# Patient Record
Sex: Male | Born: 1935 | Race: Black or African American | Hispanic: No | Marital: Single | Smoking: Current every day smoker
Health system: Southern US, Community
[De-identification: ages and names within clinical notes are randomized; demographics above are authoritative.]

## PROBLEM LIST (undated history)

## (undated) DIAGNOSIS — J449 Chronic obstructive pulmonary disease, unspecified: Secondary | ICD-10-CM

## (undated) DIAGNOSIS — N400 Enlarged prostate without lower urinary tract symptoms: Secondary | ICD-10-CM

## (undated) DIAGNOSIS — R51 Headache: Secondary | ICD-10-CM

## (undated) DIAGNOSIS — N471 Phimosis: Secondary | ICD-10-CM

## (undated) DIAGNOSIS — F32A Depression, unspecified: Secondary | ICD-10-CM

## (undated) DIAGNOSIS — Z993 Dependence on wheelchair: Secondary | ICD-10-CM

## (undated) DIAGNOSIS — K219 Gastro-esophageal reflux disease without esophagitis: Secondary | ICD-10-CM

## (undated) DIAGNOSIS — E785 Hyperlipidemia, unspecified: Secondary | ICD-10-CM

## (undated) DIAGNOSIS — R32 Unspecified urinary incontinence: Secondary | ICD-10-CM

## (undated) DIAGNOSIS — J45909 Unspecified asthma, uncomplicated: Secondary | ICD-10-CM

## (undated) DIAGNOSIS — N4889 Other specified disorders of penis: Secondary | ICD-10-CM

## (undated) DIAGNOSIS — F329 Major depressive disorder, single episode, unspecified: Secondary | ICD-10-CM

## (undated) DIAGNOSIS — M1990 Unspecified osteoarthritis, unspecified site: Secondary | ICD-10-CM

## (undated) DIAGNOSIS — D509 Iron deficiency anemia, unspecified: Secondary | ICD-10-CM

## (undated) DIAGNOSIS — M6281 Muscle weakness (generalized): Secondary | ICD-10-CM

## (undated) DIAGNOSIS — Z8659 Personal history of other mental and behavioral disorders: Secondary | ICD-10-CM

## (undated) HISTORY — PX: TOTAL HIP ARTHROPLASTY: SHX124

---

## 2001-08-22 ENCOUNTER — Inpatient Hospital Stay (HOSPITAL_COMMUNITY): Admission: EM | Admit: 2001-08-22 | Discharge: 2001-08-29 | Payer: Self-pay | Admitting: Psychiatry

## 2001-08-24 ENCOUNTER — Encounter: Payer: Self-pay | Admitting: Psychiatry

## 2001-11-15 ENCOUNTER — Inpatient Hospital Stay (HOSPITAL_COMMUNITY): Admission: RE | Admit: 2001-11-15 | Discharge: 2001-11-27 | Payer: Self-pay | Admitting: Orthopaedic Surgery

## 2001-11-16 ENCOUNTER — Encounter: Payer: Self-pay | Admitting: Orthopaedic Surgery

## 2001-11-16 ENCOUNTER — Encounter: Payer: Self-pay | Admitting: Cardiology

## 2001-11-20 ENCOUNTER — Encounter: Payer: Self-pay | Admitting: Orthopaedic Surgery

## 2002-04-30 ENCOUNTER — Inpatient Hospital Stay (HOSPITAL_COMMUNITY): Admission: EM | Admit: 2002-04-30 | Discharge: 2002-05-08 | Payer: Self-pay | Admitting: Psychiatry

## 2005-06-07 ENCOUNTER — Ambulatory Visit: Payer: Self-pay | Admitting: Gastroenterology

## 2005-06-10 ENCOUNTER — Ambulatory Visit: Payer: Self-pay | Admitting: Gastroenterology

## 2007-03-03 ENCOUNTER — Emergency Department (HOSPITAL_COMMUNITY): Admission: EM | Admit: 2007-03-03 | Discharge: 2007-03-03 | Payer: Self-pay | Admitting: Emergency Medicine

## 2010-02-11 ENCOUNTER — Encounter: Payer: Self-pay | Admitting: Gastroenterology

## 2010-02-25 ENCOUNTER — Ambulatory Visit: Payer: Self-pay | Admitting: Cardiology

## 2010-02-25 ENCOUNTER — Observation Stay (HOSPITAL_COMMUNITY)
Admission: EM | Admit: 2010-02-25 | Discharge: 2010-03-01 | Disposition: A | Discharge status: Other Acute Inpatient Hospital | Payer: Self-pay | Source: Home / Self Care | Admitting: Emergency Medicine

## 2010-02-27 ENCOUNTER — Encounter (INDEPENDENT_AMBULATORY_CARE_PROVIDER_SITE_OTHER): Payer: Self-pay | Admitting: Internal Medicine

## 2010-03-19 ENCOUNTER — Encounter (INDEPENDENT_AMBULATORY_CARE_PROVIDER_SITE_OTHER): Payer: Self-pay | Admitting: *Deleted

## 2010-03-23 ENCOUNTER — Ambulatory Visit: Payer: Self-pay | Admitting: Gastroenterology

## 2010-04-05 ENCOUNTER — Ambulatory Visit: Payer: Self-pay | Admitting: Gastroenterology

## 2010-07-08 NOTE — Miscellaneous (Signed)
Summary: [facility] Previsit/prep  Clinical Lists Changes  Medications: Added new medication of MOVIPREP 100 GM  SOLR (PEG-KCL-NACL-NASULF-NA ASC-C) As per prep instructions. - Signed Rx of MOVIPREP 100 GM  SOLR (PEG-KCL-NACL-NASULF-NA ASC-C) As per prep instructions.;  #1 x 0;  Signed;  Entered by: Wyona Almas RN;  Authorized by: Mardella Layman MD [facility];  Method used: Print then Give to Patient Allergies: Added new allergy or adverse reaction of PENICILLIN Observations: Added new observation of NKA: F (03/23/2010 10:35)    Prescriptions: MOVIPREP 100 GM  SOLR (PEG-KCL-NACL-NASULF-NA ASC-C) As per prep instructions.  #1 x 0   Entered by:   Wyona Almas RN   Authorized by:   Mardella Layman MD [facility]   Signed by:   Wyona Almas RN on 03/23/2010   Method used:   Print then Give to Patient   RxID:   [id number]

## 2010-07-08 NOTE — Letter (Signed)
Summary: Pre Visit Letter Revised  [facility]  [address] [place], [place] [address]   Phone: [phone]  Fax: [phone]        02/11/2010 MRN: [id number] Chase Benjamin [address] [place], [place]  [address]             Procedure Date:  Oct 31 at 10am   Welcome to the Gastroenterology Division at [facility].    You are scheduled to see a nurse for your pre-procedure visit on Mar 23, 2010 at 11am on the 3rd floor at [facility], [address]  We ask that you try to arrive at our office 15 minutes prior to your appointment time to allow for check-in.  Please take a minute to review the attached form.  If you answer "Yes" to one or more of the questions on the first page, we ask that you call the person listed at your earliest opportunity.  If you answer "No" to all of the questions, please complete the rest of the form and bring it to your appointment.    Your nurse visit will consist of discussing your medical and surgical history, your immediate family medical history, and your medications.   If you are unable to list all of your medications on the form, please bring the medication bottles to your appointment and we will list them.  We will need to be aware of both prescribed and over the counter drugs.  We will need to know exact dosage information as well.    Please be prepared to read and sign documents such as consent forms, a financial agreement, and acknowledgement forms.  If necessary, and with your consent, a friend or relative is welcome to sit-in on the nurse visit with you.  Please bring your insurance card so that we may make a copy of it.  If your insurance requires a referral to see a specialist, please bring your referral form from your primary care physician.  No co-pay is required for this nurse visit.     If you cannot keep your appointment, please call [phone] to cancel or reschedule prior to your appointment date.  This  allows [place] the opportunity to schedule an appointment for another patient in need of care.    Thank you for choosing [facility] for your medical needs.  We appreciate the opportunity to care for you.  Please visit [place] at our website  to learn more about our practice.  Sincerely, The Gastroenterology Division

## 2010-07-08 NOTE — Procedures (Signed)
Summary: Colonoscopy  Patient: Chase Benjamin Note: All result statuses are Final unless otherwise noted.  Tests: (1) Colonoscopy (COL)   COL Colonoscopy           DONE     [facility]     [address]     [place], [place]  [address]           COLONOSCOPY PROCEDURE REPORT           PATIENT:  Chase Benjamin, Chase Benjamin  MR#:  [id number]     BIRTHDATE:  01-29-1932, 77 yrs. old  GENDER:  male     ENDOSCOPIST:  Vania Rea. Jarold Motto, MD, [facility]     REF. BY:  Providence Crosby, M.D.     PROCEDURE DATE:  04/05/2010     PROCEDURE:  Average-risk screening colonoscopy     G0121     ASA CLASS:  Class II     INDICATIONS:  Routine Risk Screening     MEDICATIONS:   Fentanyl 37.5 mcg IV, Versed 5 mg IV           DESCRIPTION OF PROCEDURE:   After the risks benefits and     alternatives of the procedure were thoroughly explained, informed     consent was obtained.  Digital rectal exam was performed and     revealed no abnormalities.   The LB CF-[id number] endoscope     was introduced through the anus and advanced to the cecum, which     was identified by both the appendix and ileocecal valve, without     limitations.  The quality of the prep was excellent, using     MoviPrep.  The instrument was then slowly withdrawn as the colon     was fully examined.     <<PROCEDUREIMAGES>>           FINDINGS:  Scattered diverticula were found in the sigmoid to     descending colon segments.  No polyps or cancers were seen.  This     was otherwise a normal examination of the colon.   Retroflexed     views in the rectum revealed Unable to retroflex.    The scope was     then withdrawn from the patient and the procedure completed.           COMPLICATIONS:  None     ENDOSCOPIC IMPRESSION:     1) Diverticula, scattered in the sigmoid to descending colon     segments     2) No polyps or cancers     3) Otherwise normal examination     RECOMMENDATIONS:     PRN F/U ONLY     REPEAT EXAM:  No        ______________________________     Vania Rea. Jarold Motto, MD, [facility]           CC:           n.     eSIGNED:   Vania Rea. Patterson at 04/05/2010 11:38 AM           Victoriano Lain, [id number]  Note: An exclamation mark (!) indicates a result that was not dispersed into the flowsheet. Document Creation Date: 04/05/2010 11:39 AM _______________________________________________________________________  (1) Order result status: Final Collection or observation date-time: 04/05/2010 11:32 Requested date-time:  Receipt date-time:  Reported date-time:  Referring Physician:   Ordering Physician: Sheryn Bison [phone]) Specimen Source:  Source: Launa Grill Order Number: [id number] Lab site:

## 2010-07-08 NOTE — Letter (Signed)
Summary: [facility] Instructions  [facility]  [address] [place], [place] [address]   Phone: [phone]  Fax: [phone]       Chase Benjamin    [birth year]    MRN: [id number]        Procedure Day [name]:  [name]  04/05/10     Arrival Time:  9:00AM     Procedure Time:  10:00AM     Location of Procedure:                    [name] _  [facility] (4th Floor)                       PREPARATION FOR COLONOSCOPY WITH MOVIPREP   Starting 5 days prior to your procedure 03/31/10 do not eat nuts, seeds, popcorn, corn, beans, peas,  salads, or any raw vegetables.  Do not take any fiber supplements (e.g. Metamucil, Citrucel, and Benefiber).  THE DAY BEFORE YOUR PROCEDURE         DATE:  04/04/10   DAY:  SUNDAY  1.  Drink clear liquids the entire day-NO SOLID FOOD  2.  Do not drink anything colored red or purple.  Avoid juices with pulp.  No orange juice.  3.  Drink at least 64 oz. (8 glasses) of fluid/clear liquids during the day to prevent dehydration and help the prep work efficiently.  CLEAR LIQUIDS INCLUDE: Water Jello Ice Popsicles Tea (sugar ok, no milk/cream) Powdered fruit flavored drinks Coffee (sugar ok, no milk/cream) Gatorade Juice: apple, white grape, white cranberry  Lemonade Clear bullion, consomm, broth Carbonated beverages (any kind) Strained chicken noodle soup Hard Candy                             4.  In the morning, mix first dose of MoviPrep solution:    Empty 1 Pouch A and 1 Pouch B into the disposable container    Add lukewarm drinking water to the top line of the container. Mix to dissolve    Refrigerate (mixed solution should be used within 24 hrs)  5.  Begin drinking the prep at 5:00 p.m. The MoviPrep container is divided by 4 marks.   Every 15 minutes drink the solution down to the next mark (approximately 8 oz) until the full liter is complete.   6.  Follow completed prep with 16 oz of clear liquid of your choice (Nothing  red or purple).  Continue to drink clear liquids until bedtime.  7.  Before going to bed, mix second dose of MoviPrep solution:    Empty 1 Pouch A and 1 Pouch B into the disposable container    Add lukewarm drinking water to the top line of the container. Mix to dissolve    Refrigerate  THE DAY OF YOUR PROCEDURE      DATE: 04/05/10   DAY:  MONDAY  Beginning at 5:00AM (5 hours before procedure):         1. Every 15 minutes, drink the solution down to the next mark (approx 8 oz) until the full liter is complete.  2. Follow completed prep with 16 oz. of clear liquid of your choice.    3. You may drink clear liquids until 8:00AM (2 HOURS BEFORE PROCEDURE).   MEDICATION INSTRUCTIONS  Unless otherwise instructed, you should take regular prescription medications with a small sip of water   as early  as possible the morning of your procedure.  Best to take at 8:00AM after prep has eased off.       OTHER INSTRUCTIONS  You will need a responsible adult at least 75 years of age to accompany you and drive you home.   This person must remain in the waiting room during your procedure.  Wear loose fitting clothing that is easily removed.  Leave jewelry and other valuables at home.  However, you may wish to bring a book to read or  an iPod/MP3 player to listen to music as you wait for your procedure to start.  Remove all body piercing jewelry and leave at home.  Total time from sign-in until discharge is approximately 2-3 hours.  You should go home directly after your procedure and rest.  You can resume normal activities the  day after your procedure.  The day of your procedure you should not:   Drive   Make legal decisions   Operate machinery   Drink alcohol   Return to work  You will receive specific instructions about eating, activities and medications before you leave.    The above instructions have been reviewed and explained to me by  .sign    I fully understand  and can verbalize these instructions _____________________________ Date _________

## 2010-08-19 LAB — BASIC METABOLIC PANEL
BUN: 13 mg/dL (ref 6–23)
BUN: 16 mg/dL (ref 6–23)
CO2: 21 mEq/L (ref 19–32)
CO2: 22 mEq/L (ref 19–32)
CO2: 22 mEq/L (ref 19–32)
CO2: 23 mEq/L (ref 19–32)
Calcium: 8.4 mg/dL (ref 8.4–10.5)
Chloride: 110 mEq/L (ref 96–112)
Chloride: 110 mEq/L (ref 96–112)
Chloride: 111 mEq/L (ref 96–112)
Creatinine, Ser: 1.29 mg/dL (ref 0.4–1.5)
Creatinine, Ser: 1.47 mg/dL (ref 0.4–1.5)
GFR calc Af Amer: >60 mL/min (ref >60)
GFR calc Af Amer: >60 mL/min (ref >60)
GFR calc non Af Amer: 47 mL/min — ABNORMAL LOW (ref >60)
GFR calc non Af Amer: 58 mL/min — ABNORMAL LOW (ref >60)
Glucose, Bld: 102 mg/dL — ABNORMAL HIGH (ref 70–99)
Glucose, Bld: 104 mg/dL — ABNORMAL HIGH (ref 70–99)
Glucose, Bld: 111 mg/dL — ABNORMAL HIGH (ref 70–99)
Potassium: 3.5 mEq/L (ref 3.5–5.1)
Potassium: 3.5 mEq/L (ref 3.5–5.1)
Potassium: 3.6 mEq/L (ref 3.5–5.1)
Sodium: 140 mEq/L (ref 135–145)
Sodium: 140 mEq/L (ref 135–145)

## 2010-08-19 LAB — POCT CARDIAC MARKERS
CKMB, poc: <1 ng/mL — ABNORMAL LOW (ref 1.0–8.0)
CKMB, poc: <1 ng/mL — ABNORMAL LOW (ref 1.0–8.0)
Myoglobin, poc: 48 ng/mL (ref 12–200)
Myoglobin, poc: 55.7 ng/mL (ref 12–200)

## 2010-08-19 LAB — DIFFERENTIAL
Basophils Absolute: 0.1 10*3/uL (ref 0.0–0.1)
Basophils Relative: 2 % — ABNORMAL HIGH (ref 0–1)
Eosinophils Absolute: 0.2 10*3/uL (ref 0.0–0.7)
Neutro Abs: 1.9 10*3/uL (ref 1.7–7.7)
Neutrophils Relative %: 39 % — ABNORMAL LOW (ref 43–77)

## 2010-08-19 LAB — CARDIAC PANEL(CRET KIN+CKTOT+MB+TROPI)
CK, MB: 0.9 ng/mL (ref 0.3–4.0)
Relative Index: INVALID (ref 0.0–2.5)
Troponin I: 0.03 ng/mL (ref 0.00–0.06)
Troponin I: <0.01 ng/mL (ref 0.00–0.06)

## 2010-08-19 LAB — CK TOTAL AND CKMB (NOT AT ARMC)
CK, MB: 0.7 ng/mL (ref 0.3–4.0)
Relative Index: INVALID (ref 0.0–2.5)
Total CK: 43 U/L (ref 7–232)

## 2010-08-19 LAB — LIPID PANEL
Cholesterol: 108 mg/dL (ref 0–200)
HDL: 39 mg/dL — ABNORMAL LOW (ref >39)
Total CHOL/HDL Ratio: 2.8 RATIO

## 2010-08-19 LAB — TSH: TSH: 2.849 u[IU]/mL (ref 0.350–4.500)

## 2010-08-19 LAB — CBC
MCH: 38.9 pg — ABNORMAL HIGH (ref 26.0–34.0)
MCHC: 35.7 g/dL (ref 30.0–36.0)
RDW: 12.8 % (ref 11.5–15.5)

## 2010-08-19 LAB — MRSA PCR SCREENING: MRSA by PCR: NEGATIVE

## 2010-10-22 NOTE — Discharge Summary (Signed)
[facility]  Patient:    Chase Benjamin, Chase Benjamin Visit Number: [id number] MRN: [id number]          Service Type: PSY Location: 400 0401 02 Attending Physician:  Jeanice Lim Dictated by:   Jeanice Lim, M.D. Admit Date:  08/22/2001 Discharge Date: 08/29/2001                             Discharge Summary  IDENTIFYING DATA:  This is a 77 year old African-American male, voluntarily admitted, disheveled, appearing unable to care for himself, screaming, hallucinating at the time of admission.  ADMISSION MEDICATIONS:  None.  ALLERGIES:  PENICILLIN.  PHYSICAL EXAMINATION:  Essentially within normal limits, neurologically nonfocal.  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  Chest x-ray negative.  Routine admission labs mostly within normal limits.  Alkaline phosphatase was 254, potassium 3.8.  MCV increased at 103.  MENTAL STATUS EXAMINATION:  The patient was alert, in no acute distress, thin, somewhat resistant.  Speech:  Briefly answering questions.  Mood somewhat dysphoric, affect irritable, agitated at times.  Thought process goal directed.  Thought content positive for likely auditory hallucinations and possible visual hallucinations, no acute dangerous ideation.  Cognitively intact.  Judgment and insight limited.  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            2. Rule out delirium. Axis II:   Deferred. Axis III:  Osteoarthritis right hip. Axis IV:   Moderate, problems with primary support. Axis V:    30/55.  HOSPITAL COURSE:  The patient was admitted and ordered routine p.r.n. medications, monitored for safety, restarted on Risperdal, Motrin for pain, placed on fall precautions.  Risperdal was titrated as indicated for psychotic symptoms and x-ray of the hip due to increased alkaline phosphatase was obtained.  Risperdal was optimized and the patient showed clear  improvement. Family meeting was held and they were happy with the progress.  CONDITION ON DISCHARGE:  Markedly improved.  Mood was more euthymic, affect brighter, thought process goal directed.  Thought content negative for dangerous ideation and patient reported no longer experiencing hallucinations, feeling that his thinking was more clear, reporting motivation to be compliant with a followup plan.  DISCHARGE MEDICATIONS: 1. Risperdal 0.5 mg 1/2 q.a.m., 1/2 3 p.m., and 4 q.h.s. 2. Ambien 5 mg q.h.s. p.r.n. insomnia. 3. Motrin 400 mg q.6 p.r.n. pain.  DISPOSITION:  The patient was to follow up with [facility] on Tuesday, April 1 at 1:30 and with [facility] March 31 at 2:15.  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            2. Rule out delirium. Axis II:   Deferred. Axis III:  Osteoarthritis right hip. Axis IV:   Moderate, problems with primary support. Axis V:    Global assessment of function on discharge was 50. Dictated by:   Jeanice Lim, M.D. Attending Physician:  Jeanice Lim DD:  10/04/01 TD:  10/06/01 Job: [id number]

## 2010-10-22 NOTE — Discharge Summary (Signed)
[place]  Patient:    Chase Benjamin, Chase Benjamin Visit Number: [id number] MRN: [id number]          Service Type: SUR Location: 5000 5006 01 Attending Physician:  Marcene Corning Dictated by:   Prince Rome, P.A. Admit Date:  11/15/2001 Disc. Date: 11/27/01                             Discharge Summary  ADMITTING DIAGNOSES: 1. 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 2. Depression.  DISCHARGE DIAGNOSES: 1. 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 2. Depression. 3. Syncopal episode. 4. Blood loss anemia.  HISTORY OF PRESENT ILLNESS:  This is a 77 year old, African-American male who is a patient of our practice who presented with right hip pain and ability to walk comfortably or sleep at nighttime.  His x-rays revealed that he had end-stage DJD of his right hip.  We discussed treatment options with the patient with total hip replacement and risk of infection and risk of DVT discussed with him as well.  LABORATORY DATA AND X-RAY FINDINGS:  Last INR 2.3.  Hemoglobin 9.3, hematocrit 28.1.  Blood was replaced as necessary.  Other indices can be found in the chart.  He did have myocardial imaging and noted to have ejection fraction of 63.  Spiral CT was negative for pulmonary embolus.  Regular chest x-ray showed cardiomegaly with mild pulmonary vascular congestion.  HOSPITAL COURSE:  The patient was admitted postoperatively and placed on a variety of p.o. and IM analgesics for pain.  His usual medications were Risperdal and Ambien.  He could be touchdown weightbearing as he had a cemented prosthesis.  The pharmacy was ordered for low-dose Coumadin protocol to prevent DVTs.  He was given IV Ancef 1 g q.8h. x3 doses.  Physical therapy was ordered for help with weightbearing.  During his postoperative phase, his dressing was changed with wound benign and no sign of irritation or infection. He did have a questionable syncopal episode  and did undergo a cardiac workup which showed some EKG changes, but an ejection fraction of 63 and the medical teaching service was helpful in helping [place] deal with this patient.  He was able to go to extended care.  A bed was found and he was discharged home to the extended care unit.  CONDITION ON DISCHARGE:  Improved.  SPECIAL INSTRUCTIONS:   He will remain on Coumadin for four weeks total.  FOLLOWUP:  His staples will be removed in three to four days after discharge to the extended care unit.  DISCHARGE MEDICATIONS: 1. Colace 100 mg one p.o. b.i.d. 2. Ambien 10 mg one p.o. q.h.s. 3. Risperdal 0.5 p.o. q.a.c. 4. Enteric coated aspirin 162 mg q.d. 5. Pepcid 20 mg p.o. b.i.d. 6. Lopressor 12.5 mg p.o. q.12h. 7. Coumadin protocol. 8. Laxative of choice. Dictated by:   Prince Rome, P.A. Attending Physician:  Marcene Corning DD:  11/27/01 TD:  11/27/01 Job: [id number]

## 2010-10-22 NOTE — Op Note (Signed)
[place]  Patient:    Chase Benjamin, Chase Benjamin Visit Number: [id number] MRN: [id number]          Service Type: SUR Location: 5500 5524 01 Attending Physician:  Marcene Corning Dictated by:   Lubertha Basque. Jerl Santos, M.D. Proc. Date: 11/15/01 Admit Date:  11/15/2001                             Operative Report  PREOPERATIVE DIAGNOSIS:  Right hip degenerative arthritis.  POSTOPERATIVE DIAGNOSIS:  Right hip degenerative arthritis.  OPERATION:  Right total hip replacement.  SURGEON:  Lubertha Basque.Jerl Santos, M.D.  ASSISTANT:  Prince Rome, P.A.  INDICATIONS:  The patient is a 77 year old man with 20-30 years of severe right hip pain.  This has become progressively worse and he is resistant to oral anti-inflammatories.  He has to use a cane to get around at this point and that is also difficult.  He is offered a hip replacement operation. Informed operative consent was obtained after discussion of possible complications of, reaction to anesthesia, infection, dislocation, DVT, PE and death.  DESCRIPTION OF PROCEDURE:  The patient was taken to the operating suite where general anesthetic was applied without difficulty.  He was positioned in the lateral decubitus position with the right hip up.  All bony prominences were appropriately padded and an axillary roll was placed.  Hip positioners were used.  He was then prepped and draped in a normal sterile fashion.  After the administration of preoperative antibiotics, the posterior approach to the right hip was taken through a smallincision.  All appropriate anti-infective measures were used including Betadine impregnated drape, IV antibiotics and closed _____ exhaust systems for each member of the surgical team.  He had a paucity of adipose tissue which was dissected through to expose the IT band and fascia of the gluteus maximus.  This was incised longitudinally to expose the short external rotators of the hip  which were tagged and reflected along with the capsule.  The hip then dislocated out of place.  It was severely deformed with femoral head with a femorally shaped acetabulum.  There were large bony osteophytes which were removed and several large bony loose bodies which were removed.  He had excellent bone quality, and it was felt that a Press-Fit prosthesis would be possible and advisable due to his relatively young age.  The acetabulum was addressed first.  This was fully exposed. Reaming was taken medially with a small reamer to the inside wall of the pelvis.  The acetabulum was then sequentially reamed up to size 53 followed by placement of a 54 Dupuy porous coated cup with a single apical hole. Excellent fixation was achieved in appropriate anteversion and tilt.  The central hole was then filled with the metal hole filler.  The high wall acetabular liner was then placed.  Attention was then turned toward the femur. This was easily exposed.  He did not have much of a femoral neck, and the neck cut was just above the lesser trochanter.  His leg lengths were about 1 cm or 1.5 cm short on the right so plan was to add some lengthto the side. The canal was easily sounded followed by sequential reaming up to size 8. The broaches were then used up to a Sumit 8 component.  Atrial reduction was done and the stability and length were felt to be best with the high offset +1 head and neck assembly.  The trial components were removed followed by placement of a size 8 Sumit high offset 8 was placed.  This seated completely.  On a dry neck, the 34 mm stainless steel ball was applied.  The hip was then reduced. His lengths were felt to beroughly equal.  He had no significant undue tension on the anterior musculature of the thigh towards extension. He was stable in extension with external rotation, stable in flexion with internal rotation.  The wound was then thoroughly irrigated followed by  reapproximation of the short external rotators to the greater trochanter with nonabsorbable suture.  The fascia lata and gluteus maximus fascia were reapproximated with #1 Vicryl followed by subcutaneous reapproximation with 0 and 2-0 undyed Vicryl andskin closure with staples.  Adaptic was applied to the wound followed bya dry gauze and tape.  Estimated blood loss and intraoperative fluids can be obtained from the anesthesia records.  DISPOSITION:  The patient was extubated in the operating room and taken to the recovery room in stable condition.  Plans were for him to be admitted to the orthopedic service for appropriate postoperative care to include perioperative antibiotics and Coumadin for DVT prophylaxis. Dictated by:   Lubertha Basque Jerl Santos, M.D. Attending Physician:  Marcene Corning DD:  11/16/18 TD:  11/17/01 Job: [id number]

## 2010-10-22 NOTE — H&P (Signed)
Chase Benjamin, GRANIER NO.:  [id number]   MEDICAL RECORD NO.:  [id number]                   PATIENT TYPE:  IPS   LOCATION:  0404                                 FACILITY:  [facility]   PHYSICIAN:  Geoffery Lyons, M.D.                   DATE OF BIRTH:  27-Sep-1931   DATE OF ADMISSION:  04/30/2002  DATE OF DISCHARGE:                         PSYCHIATRIC ADMISSION ASSESSMENT   IDENTIFYING INFORMATION:  This is a 77 year old African-American male who is  single.  This is an involuntary admission.   HISTORY OF PRESENT ILLNESS:  Patient with a history of psychosis and having  had a first psychotic break in April 2003, was yesterday found naked in the  front yard of his residence.  He was staring, agitated and disorganized and  reportedly has been on no medications since June of 2003.  This information  is taken from the petition.  He is unable to participate in interview today  due to his mental status and we are unable to reach any family in spite of  several attempts.   Patient's history is taken from the record.   PAST PSYCHIATRIC HISTORY:  Patient has been followed at [facility] but has apparently been noncompliant for several months.  This  is his second admission to [facility].  He does  have a distant past history of alcohol abuse.  No significant history of  mental illness other than his prior inpatient admission in April 2003.   SOCIAL HISTORY:  This is a single, African-American male who lives in his  own apartment here in [place] with a longtime companion named Vilma Prader.  He apparently has some supportive family according to previous  records.  No apparent legal charges.   FAMILY HISTORY:  Not available.   ALCOHOL AND DRUG HISTORY:  We know that the patient is a smoker and he has a  distant history of alcohol abuse.  No recent evidence of alcohol abuse.   MEDICAL HISTORY:  Patient's primary care  Lawrencia Mauney is unclear.  Medical  problems include a long history of osteoarthritis of his right hip with  total hip replacement of his right hip in June 2003.   MEDICATIONS:  Risperdal 0.5 mg q.a.m. and 2 mg at h.s. Originally these were  his discharge medications, which apparently he has not taken any medications  since approximately June.  He takes no other medications that we are aware  of.   DRUG ALLERGIES:  PENICILLIN.   REVIEW OF SYSTEMS:  Patient's only concern today is some generalized aching  in his right hip that he points to and says that he has had surgery there.   PHYSICAL EXAMINATION:  GENERAL:  This is a thin, elderly, African-American  male who is in the bed and is fairly calm at this point.  He had been  agitated and screaming out at the time of admission.  He has a one-to-one  aid with him for safety and is on one-to-one observation.  VITAL SIGNS:  At the time of admission, temperature 97, pulse 105, his blood  pressure was 150/91, respirations are not recorded.  He is 5 feet, 9 inches  tall.  He is fully alert and appears in no acute distress.  SKIN:  Medium tone, warm and dry.  Skin is intact.  No signs of pressure  sores.  He does have surgical scar that is well healed along his right hip.  HEAD:  Normocephalic and atraumatic.  EENT:  PERRLA.  Arcus senilis present bilaterally.  Hearing is intact to  normal voice.  No rhinorrhea.  Several teeth are missing  Oropharynx is  otherwise unremarkable.  NECK:  Supple, no evidence of thyromegaly.  CARDIOVASCULAR:  S1 and S2 is heard.  No pedal edema.  No JVD.  Apical pulse  is synchronous with radial pulse and of regular rate.  LUNGS:  Clear to auscultation.  ABDOMEN:  Flat, soft, nontender.  GENITALIA:  Deferred.  MUSCULOSKELETAL:  Patient appears to have some deformity of his right hip  joint.  He winces when coming to standing position and moves somewhat slowly  but shows no signs of distress when he is up walking  around.  He ambulates  fairly well without assistive devices.  He does hold to walls and furniture.  No redness or erythema of any joints.  NEUROLOGICAL:  Exam is limited by the patient's inability to cooperate with  simple commands.  His grip strength is equal bilaterally.  Motor movements  without tremor.  His ocular tracking appears normal.  Romberg deferred.   DIAGNOSTIC STUDIES:  Reveal that the patient's CBC reveals a normal white  count at 9400.  His hemoglobin is 14.6, hematocrit 41.2.  His MCV is mildly  elevated at 105.3.  This compared to 103 at his April admission.  Metabolic  panel reveals electrolytes are within normal limits.  His glucose was mildly  elevated at 124 and this was a random specimen.  His alkaline phosphatase  was mildly elevated at 131.  Liver enzymes within normal limits.  His BUN is  17, creatinine 1.4.  Patient's thyroid panel is currently pending.  Patient's CT scan without contrast done in March in 2003 reveals small  vessel disease with old small infarcts.  He has not had a CT scan since that  time.   MENTAL STATUS EXAM:  This is a wiry, muscularly-built, underweight-appearing  male with a blunted and suspicious affect who is generally resistant to  interview.  He is fully alert.  He is directable and generally cooperative  at this point with much encouragement.  Speech shows some mild pressure.  He  is oriented to person, place and generally to time but not to his situation,  believing that he is here in the hospital because of his arthritis in his  hip which has been a longstanding problem for him over many years.  His  general mood is guarded and irritable.  He has been occasionally getting  quite confused with the nurses and hollering out and being somewhat agitated  and combative during the admission process.   DIAGNOSES:   AXIS I:  1. Psychosis not otherwise specified. 2. Rule out psychosis secondary to dementia.   AXIS II:  No  diagnosis.   AXIS III:  1. Mildly elevated T4.  2. Osteoarthritis of his right  hip.   AXIS IV:  Deferred.   AXIS V:  Current 18, past year 53.   PLAN:  1. Involuntarily admit the patient with one-to-one observation for safety.  2. We will consider getting a CT of his brain without contrast media to     consider ruling out any additional acute vascular events.  3. Meanwhile, he is a fall risk and we have placed him on fall precautions.  4. We are giving him Ativan 1 mg p.o. q.6h. p.r.n. for anxiety.  5. We will check a B12 level given his elevated MCV.  6. Meanwhile, we will use regular May 03, 2002 to treat his pain.  7. We are going to ask the case manager to get in touch with his family to     get any additional history.  8. Since he has been previously managed on Risperdal, given his age and the     fact that he has old lacunar infarcts on his MRI we will switch him to     Zyprexa 2.5 mg p.o. noon and 6 p.m. and 0.5 mg p.o. q.h.s.   ESTIMATED LENGTH OF STAY:  Five days.     Margaret A. Scott, N.P.                   Geoffery Lyons, M.D.    MAS/MEDQ  D:  05/03/2002  T:  05/03/2002  Job:  [id number]

## 2010-10-22 NOTE — Consult Note (Signed)
. [facility]  Patient:    Chase Benjamin, Chase Benjamin Visit Number: [id number] MRN: [id number]          Service Type: SUR Location: [id number] Attending Physician:  Marcene Corning Dictated by:   Koleen Distance, M.D. Proc. Date: 11/17/01 Admit Date:  11/15/2001   CC:         [facility]   Consultation Report  DATE OF BIRTH: 1931/08/30  CHIEF COMPLAINT: Syncope and EKG abnormalities.  HISTORY OF PRESENT ILLNESS: Chase Benjamin is a 77 year old black male with no significant past medical history, who underwent elective right total hip replacement on November 15, 2001. On November 16, 2001 while undergoing physical therapy, Chase Benjamin was witnessed to become unresponsive. By the patients report, Chase Benjamin only remembers intense pain. It is unclear how long Chase Benjamin was unresponsive. It is unclear if there were any changes in Chase Benjamin vital signs during this episode. Upon awakening, Chase Benjamin had no complaints other than the pain in Chase Benjamin right hip. Chase Benjamin functional capacity had been limited by Chase Benjamin hip pain, though Chase Benjamin denies symptoms of chest discomfort, dyspnea on exertion, orthopnea, PND, or edema. Chase Benjamin has never had syncopal or presyncopal events in the past. Chase Benjamin has never had any reason to suspect cardiac problems.  PAST MEDICAL HISTORY: Psychosis not otherwise specified, degenerative arthritis of the right hip, status post total hip replacement on November 15, 2001; CT evidence of lacunar stroke.  SOCIAL HISTORY: Chase Benjamin lives in [place] with himself. 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 Chase Benjamin was never married. Chase Benjamin has two children. Chase Benjamin smokes three to four cigarettes per day and has done so since age 75. Chase Benjamin has a total of fifteen pack years. Chase Benjamin alcohol intake is none. Chase Benjamin does not use illicit drugs. Chase Benjamin does not use herbal medications or over-the-counter medications. Chase Benjamin diet is unrestricted. Chase Benjamin states that Chase Benjamin exercise is limited by Chase Benjamin hip pain.  FAMILY HISTORY:  Chase Benjamin mother died in her 17s but Chase Benjamin is unsure of what. Chase Benjamin is unsure of Chase Benjamin fathers medical history.  REVIEW OF SYSTEMS: Remarkable only for right hip pain.  PHYSICAL EXAM:  VITAL SIGNS: Temperature 98.7. BP 113/58. Pulse 90.  GENERAL: A thin elderly male in no acute distress.  HEENT: Unremarkable.  NECK: Supple. There are no bruits. No jugular venous distention. There is no thyromegaly.  CARDIAC: Regular rate and rhythm. No murmur, rub, or gallop.  LUNGS: Clear.  ABDOMEN: Exam is unremarkable.  EXTREMITIES: Chase Benjamin has no femoral bruits. Good distal pulses. No edema.  NEURO: Exam is grossly nonfocal.  DIAGNOSTIC STUDIES: Chest x-ray, there is none available. CT chest on November 16, 2001 was negative for pulmonary embolism but did show evidence of bilateral subsegmental atelectasis. ECG most recently reveals a rate of 103 with normal sinus rhythm with a normal axis. Chase Benjamin does have ST segment depression and T-wave abnormalities that could be consistent with ischemia in the anterolateral leads.  LABORATORY DATA: CBC revealed a white count of 8.8. H&H are 7.4 and 21.5 respectively. The hematocrit preceeding this one was 27.6. Platelets are 125 and the platelets prior were 146. Chem 7 revealed sodium of 134, potassium 3.7, chloride 105, bicarb 24, BUN 8, creatinine 1.2, glucose 166. GI panel revealed a total bili of 0.7, AST of 20, ALT of 12, alkaphos of 100. Total protein and albumin were 5.3 and 2.5 respectively. CK, MB, and troponin peaked at 320, 1, and 0.01.  ASSESSMENT/PLAN:  #1 - SYNCOPE: This  sounds like a vaso vagal response to pain in the setting of recent surgery, opiates, and CNS depressants, and anemia. It does not appear that the patient had true cardiogenic syncope from arrhythmia or LV dysfunction. It would be reasonable to continue telemetry until Chase Benjamin EKG changes resolve, or Chase Benjamin is 72 hours out from Chase Benjamin event.  #2 - EKG ABNORMALITIES: Chase Benjamin certainly has STT wave changes  in the anterolateral leads that could be consistent with ischemia. It is reassuring that Chase Benjamin has not had positive cardiac markers. Chase Benjamin will likely benefit from transfusion to increase Chase Benjamin O2 carrying capacity. In addition, a beta blocker would be helpful in the perioperative setting, to decrease myocardial oxygen demand and prevent potential rhythm disturbance. We would also recommend an aspirin at 162 mg per day. This should be continued for primary prevention, indefinitely. Chase Benjamin cardiac risk factors thus far are age, male gender, and smoking history. Chase Benjamin lipid status is unknown but given Chase Benjamin albumin of 2.5, it is likely that Chase Benjamin is relatively under nourished and does not have a terribly high LDL. It may not be helpful to check it at this time, given Chase Benjamin surgery and probable acute phase response. It would be reasonable to pursue a functional study to risk stratify Chase Benjamin. It is not imperative that this be done during this hospitalization. If Chase Benjamin is started on good medical treatment (aspirin, beta blocker, lipid lowering therapy if indicated, and possibly ace inhibition), we could defer this to be done as an outpatient.  #3 - PSYCHIATRIC HISTORY: Chase Benjamin has a history of psychosis. It is possible that Reglan could potentate Chase Benjamin psychiatric symptoms. I have discussed this with the primary team and will go ahead and discontinue that medication. In addition, it might be helpful to discontinue any other CNS depressants that are not necessary. Dictated by:   Koleen Distance, M.D. Attending Physician:  Marcene Corning DD:  11/17/01 TD:  11/19/01 Job: [id number]

## 2010-10-22 NOTE — Discharge Summary (Signed)
Chase Benjamin, Chase Benjamin NO.:  [id number]   MEDICAL RECORD NO.:  [id number]                   PATIENT TYPE:  IPS   LOCATION:  0404                                 FACILITY:  [facility]   PHYSICIAN:  Geoffery Lyons, M.D.                   DATE OF BIRTH:  Oct 02, 1931   DATE OF ADMISSION:  04/30/2002  DATE OF DISCHARGE:  05/08/2002                                 DISCHARGE SUMMARY   CHIEF COMPLAINT AND PRESENT ILLNESS:  This was the second admission to [facility] for this 77 year old African-American male, single,  involuntarily committed.  History of psychosis, having had first psychotic  break in April of 2003.  Found naked in the front yard of his residency.  He  was staring, agitated and disorganized and reportedly has been on no  medication since June 2003.  Unable to participate initially in the  interview.   PAST PSYCHIATRIC HISTORY:  [facility].  Noncompliance.   ALCOHOL/DRUG HISTORY:  Distant history of alcohol abuse.  None presently.   PAST MEDICAL HISTORY:  Long history of osteoarthritis, right total hip  replacement.   MEDICATIONS:  Supposed to be taking Risperdal 0.5 mg in the morning and 2 mg  at night.  Not taking since June.   PHYSICAL EXAMINATION:  Performed and failed to show any acute findings.   MENTAL STATUS EXAM:  Revealed a wiry, muscular-built, underweight appearing  male with blunted and suspicious affect.  __________  to the interview.  Fully alert.  Very directible, very cooperative with encouragement.  Speech  showed some mild pressure.  Oriented to person, place and time.  Not to  situation, believing that he is in the hospital for his arthritis.  __________ and he is irritable.  At times confused.   ADMISSION DIAGNOSES:   AXIS I:  Psychotic disorder not otherwise specified.   AXIS II:  No diagnosis.   AXIS III:  1. Mildly elevated T4.  2. Osteoarthritis.   AXIS IV:   Moderate.   AXIS V:  Global Assessment of Functioning upon admission 20-25; highest  Global Assessment of Functioning in the last year 60.   LABORATORY DATA:  CBC was within normal limits.  Blood chemistries were  within normal limits.  Blood sugar was 124.  SGOT 19, SGPT 131.  T4 was  12.4.  B12 was 588.   HOSPITAL COURSE:  He was admitted.  He had to be kept one-to-one initially.  Agitated, especially in the morning.  Not as spontaneous.  Guarded,  reserved, refusing to eat, responding to internal stimuli.  Was placed on  Zyprexa 2.5 mg twice a day and 5 mg at bedtime.  Most every day during the  morning, an episode of agitation but he was very redirectible.  We continued  to optimize treatment with Zyprexa.  Continued one-on-one.  Becoming very  impatient, difficult to concentrate and communicate his needs.  On May 05, 2002, he had improved some.  No further agitation so one-to-one was  discontinued as there was no wandering and no agitation.  We continued to  optimize treatment with medications and, on May 06, 2002, had been 48  hours without one-on-one since admission.  Continued to improve, settled  down, became more spontaneous, more pleasant.  On May 07, 2002, felt  that he was baseline.  We started continuing to work on discharge planning.  On May 08, 2002, he was much improved, baseline, no evidence of active  psychosis.  No suicidal ideation.  No homicidal ideation.  Willing to follow  up on an outpatient basis, so he was discharged.   DISCHARGE DIAGNOSES:   AXIS I:  Psychotic disorder not otherwise specified.   AXIS II:  No diagnosis.   AXIS III:  Osteoarthritis.   AXIS IV:  Moderate.   AXIS V:  Global Assessment of Functioning upon discharge 50.   DISCHARGE MEDICATIONS:  Zyprexa 2.5 mg twice a day and 5 mg at night.   FOLLOW UP:  [facility].                                               Geoffery Lyons, M.D.     IL/MEDQ  D:  06/11/2002  T:  06/11/2002  Job:  [id number]

## 2011-03-17 LAB — URINALYSIS, ROUTINE W REFLEX MICROSCOPIC
Bilirubin Urine: NEGATIVE
Glucose, UA: NEGATIVE
Ketones, ur: NEGATIVE
Nitrite: NEGATIVE
Specific Gravity, Urine: 1.022
pH: 5.5

## 2012-04-26 ENCOUNTER — Other Ambulatory Visit: Payer: Self-pay | Admitting: Urology

## 2012-04-27 ENCOUNTER — Encounter (HOSPITAL_BASED_OUTPATIENT_CLINIC_OR_DEPARTMENT_OTHER): Payer: Self-pay | Admitting: *Deleted

## 2012-04-30 NOTE — Progress Notes (Addendum)
SPOKE W/ ROSE , NURSE AT [facility] PLACE (WHERE PT RESIDES). PT DOES NOT WANT SURGERY. CHASITY AWARE AND SHE AWAITS ADVICE FROM DR NESI. (WHETHER TO CX OR NOT)  SPOKE W/ ROSE TODAY ABOUT PT SURGERY AND THE PT DAUGHTER SPOKE WITH HER FATHER AND HIS HAS AGREED TO HAVE SURGERY. FAXED NUMBER GIVEN TO HAVE MAR AND HX FAXED.Marland Kitchen ALSO, ROSE STATES THAT DAUGHTER IS TRANSPORTED PT DOS.

## 2012-05-02 ENCOUNTER — Encounter (HOSPITAL_BASED_OUTPATIENT_CLINIC_OR_DEPARTMENT_OTHER): Payer: Self-pay | Admitting: *Deleted

## 2012-05-02 NOTE — Progress Notes (Signed)
RECEIVED INFO FROM [facility]ACE.  SPOKE W/ ROSE BLACKMAN ASSISTANT RCD (CONTACT PERSON). INSTRUCTIONS GIVEN FOR PT STOP ASA TODAY, OKAY TO CONTINUE CURRENT VITAMANS. PT NPO AFTER MN WITH EXCEPT SIPS OF WATER W/ PRILOSEC AND SPIRIVA AM OF SURG. PT TO ARRIVE AT 0900.  LM W/ PT DAUGHTER TO CALL BACK TO GIVE INSTRUCTIONS TO HER ALSO, SINCE SHE WILL BE TRANSPORTING PT. PT IS IN W/C BUT CAN STAND WITH ASSIST. [facility]ACE # [phone].  Alyson Locket [facility] # [phone].

## 2012-05-08 ENCOUNTER — Ambulatory Visit (HOSPITAL_BASED_OUTPATIENT_CLINIC_OR_DEPARTMENT_OTHER): Admission: RE | Admit: 2012-05-08 | Payer: Medicare Other | Source: Ambulatory Visit | Admitting: Urology

## 2012-05-08 ENCOUNTER — Encounter (HOSPITAL_BASED_OUTPATIENT_CLINIC_OR_DEPARTMENT_OTHER): Admission: RE | Payer: Self-pay | Source: Ambulatory Visit

## 2012-05-08 HISTORY — DX: Iron deficiency anemia, unspecified: D50.9

## 2012-05-08 HISTORY — DX: Gastro-esophageal reflux disease without esophagitis: K21.9

## 2012-05-08 HISTORY — DX: Headache: R51

## 2012-05-08 HISTORY — DX: Depression, unspecified: F32.A

## 2012-05-08 HISTORY — DX: Other specified disorders of penis: N48.89

## 2012-05-08 HISTORY — DX: Chronic obstructive pulmonary disease, unspecified: J44.9

## 2012-05-08 HISTORY — DX: Personal history of other mental and behavioral disorders: Z86.59

## 2012-05-08 HISTORY — DX: Unspecified asthma, uncomplicated: J45.909

## 2012-05-08 HISTORY — DX: Unspecified osteoarthritis, unspecified site: M19.90

## 2012-05-08 HISTORY — DX: Major depressive disorder, single episode, unspecified: F32.9

## 2012-05-08 HISTORY — DX: Hyperlipidemia, unspecified: E78.5

## 2012-05-08 HISTORY — DX: Phimosis: N47.1

## 2012-05-08 HISTORY — DX: Dependence on wheelchair: Z99.3

## 2012-05-08 SURGERY — CIRCUMCISION, ADULT
Anesthesia: General

## 2012-05-09 ENCOUNTER — Other Ambulatory Visit: Payer: Self-pay | Admitting: Urology

## 2012-05-24 ENCOUNTER — Encounter (HOSPITAL_BASED_OUTPATIENT_CLINIC_OR_DEPARTMENT_OTHER): Payer: Self-pay | Admitting: *Deleted

## 2012-05-24 NOTE — Progress Notes (Signed)
Left message for Kelle Darting, pt coordinator at [facility] place where pt resides. Will give instructions to rose and confirm pt hx and meds.

## 2012-05-24 NOTE — Progress Notes (Signed)
Spoke w pt.  Hx confirmed. Instructed npo p mn 12/26 x am meds w sip of water.  To [facility] 12/27 @ 1015.  Needs istat on arrival

## 2012-05-25 NOTE — Progress Notes (Signed)
Spoke w/ Britta Mccreedy at [facility] .  Gave her preop instruction pt to be npo p mn 12/26 x meds w sip of water.  To [facility] 12/27.  Needs istat on arrival.

## 2012-06-01 ENCOUNTER — Encounter (HOSPITAL_BASED_OUTPATIENT_CLINIC_OR_DEPARTMENT_OTHER)
Admission: RE | Disposition: A | Discharge status: Home / Self Care | Payer: Self-pay | Source: Ambulatory Visit | Attending: Urology

## 2012-06-01 ENCOUNTER — Ambulatory Visit (HOSPITAL_BASED_OUTPATIENT_CLINIC_OR_DEPARTMENT_OTHER): Payer: Medicare Other | Admitting: Anesthesiology

## 2012-06-01 ENCOUNTER — Ambulatory Visit (HOSPITAL_BASED_OUTPATIENT_CLINIC_OR_DEPARTMENT_OTHER)
Admission: RE | Admit: 2012-06-01 | Discharge: 2012-06-01 | Disposition: A | Discharge status: Home / Self Care | Payer: Medicare Other | Source: Ambulatory Visit | Attending: Urology | Admitting: Urology

## 2012-06-01 ENCOUNTER — Encounter (HOSPITAL_BASED_OUTPATIENT_CLINIC_OR_DEPARTMENT_OTHER): Payer: Self-pay | Admitting: Anesthesiology

## 2012-06-01 ENCOUNTER — Encounter (HOSPITAL_BASED_OUTPATIENT_CLINIC_OR_DEPARTMENT_OTHER): Payer: Self-pay | Admitting: *Deleted

## 2012-06-01 DIAGNOSIS — N478 Other disorders of prepuce: Secondary | ICD-10-CM | POA: Insufficient documentation

## 2012-06-01 DIAGNOSIS — K219 Gastro-esophageal reflux disease without esophagitis: Secondary | ICD-10-CM | POA: Insufficient documentation

## 2012-06-01 DIAGNOSIS — N471 Phimosis: Secondary | ICD-10-CM | POA: Insufficient documentation

## 2012-06-01 DIAGNOSIS — J449 Chronic obstructive pulmonary disease, unspecified: Secondary | ICD-10-CM | POA: Insufficient documentation

## 2012-06-01 DIAGNOSIS — J4489 Other specified chronic obstructive pulmonary disease: Secondary | ICD-10-CM | POA: Insufficient documentation

## 2012-06-01 DIAGNOSIS — E785 Hyperlipidemia, unspecified: Secondary | ICD-10-CM | POA: Insufficient documentation

## 2012-06-01 HISTORY — PX: CIRCUMCISION: SHX1350

## 2012-06-01 LAB — POCT I-STAT 4, (NA,K, GLUC, HGB,HCT)
HCT: 39 % (ref 39.0–52.0)
Hemoglobin: 13.3 g/dL (ref 13.0–17.0)
Potassium: 3.9 mEq/L (ref 3.5–5.1)
Sodium: 144 mEq/L (ref 135–145)

## 2012-06-01 SURGERY — CIRCUMCISION, ADULT
Anesthesia: General | Site: Penis | Wound class: Clean

## 2012-06-01 MED ORDER — LACTATED RINGERS IV SOLN
INTRAVENOUS | Status: DC
  Administered 2012-06-01: 10:00:00 via INTRAVENOUS
  Filled 2012-06-01: qty 1000

## 2012-06-01 MED ORDER — LIDOCAINE HCL (CARDIAC) 20 MG/ML IV SOLN
INTRAVENOUS | Status: DC | PRN
  Administered 2012-06-01: 50 mg via INTRAVENOUS

## 2012-06-01 MED ORDER — ONDANSETRON HCL 4 MG/2ML IJ SOLN
INTRAMUSCULAR | Status: DC | PRN
  Administered 2012-06-01: 4 mg via INTRAVENOUS

## 2012-06-01 MED ORDER — FENTANYL CITRATE 0.05 MG/ML IJ SOLN
INTRAMUSCULAR | Status: DC | PRN
  Administered 2012-06-01: 50 ug via INTRAVENOUS
  Administered 2012-06-01 (×2): 25 ug via INTRAVENOUS

## 2012-06-01 MED ORDER — DEXAMETHASONE SODIUM PHOSPHATE 4 MG/ML IJ SOLN
INTRAMUSCULAR | Status: DC | PRN
  Administered 2012-06-01: 4 mg via INTRAVENOUS

## 2012-06-01 MED ORDER — LACTATED RINGERS IV SOLN: INTRAVENOUS | Status: DC

## 2012-06-01 MED ORDER — BUPIVACAINE HCL (PF) 0.25 % IJ SOLN
INTRAMUSCULAR | Status: DC | PRN
  Administered 2012-06-01: 10 mL

## 2012-06-01 MED ORDER — PROPOFOL 10 MG/ML IV BOLUS
INTRAVENOUS | Status: DC | PRN
  Administered 2012-06-01: 100 mg via INTRAVENOUS

## 2012-06-01 MED ORDER — FENTANYL CITRATE 0.05 MG/ML IJ SOLN: 25.0000 ug | INTRAMUSCULAR | Status: DC | PRN

## 2012-06-01 SURGICAL SUPPLY — 26 items
BANDAGE CO FLEX L/F 1IN X 5YD (GAUZE/BANDAGES/DRESSINGS) IMPLANT
BANDAGE CONFORM 2  STR LF (GAUZE/BANDAGES/DRESSINGS) ×1 IMPLANT
BLADE SURG 15 STRL LF DISP TIS (BLADE) ×1 IMPLANT
BLADE SURG 15 STRL SS (BLADE) ×2
BNDG COHESIVE 1X5 TAN STRL LF (GAUZE/BANDAGES/DRESSINGS) ×1 IMPLANT
CLOTH BEACON ORANGE TIMEOUT ST (SAFETY) ×2 IMPLANT
COVER MAYO STAND STRL (DRAPES) ×2 IMPLANT
COVER TABLE BACK 60X90 (DRAPES) ×2 IMPLANT
DRAPE PED LAPAROTOMY (DRAPES) ×2 IMPLANT
ELECT REM PT RETURN 9FT ADLT (ELECTROSURGICAL) ×2
ELECTRODE REM PT RTRN 9FT ADLT (ELECTROSURGICAL) ×1 IMPLANT
GAUZE VASELINE 1X8 (GAUZE/BANDAGES/DRESSINGS) ×1 IMPLANT
GLOVE BIO SURGEON STRL SZ7 (GLOVE) ×4 IMPLANT
GLOVE INDICATOR 7.0 STRL GRN (GLOVE) ×1 IMPLANT
GOWN PREVENTION PLUS LG XLONG (DISPOSABLE) ×2 IMPLANT
NDL HYPO 25X1 1.5 SAFETY (NEEDLE) ×1 IMPLANT
NEEDLE HYPO 25X1 1.5 SAFETY (NEEDLE) ×2 IMPLANT
NS IRRIG 500ML POUR BTL (IV SOLUTION) ×1 IMPLANT
PACK BASIN DAY SURGERY FS (CUSTOM PROCEDURE TRAY) ×2 IMPLANT
PENCIL BUTTON HOLSTER BLD 10FT (ELECTRODE) ×2 IMPLANT
SUT CHROMIC 4 0 P 3 18 (SUTURE) ×2 IMPLANT
SUT CHROMIC 5 0 RB 1 27 (SUTURE) IMPLANT
SYR CONTROL 10ML LL (SYRINGE) ×2 IMPLANT
TOWEL OR 17X24 6PK STRL BLUE (TOWEL DISPOSABLE) ×4 IMPLANT
TRAY DSU PREP LF (CUSTOM PROCEDURE TRAY) ×2 IMPLANT
WATER STERILE IRR 500ML POUR (IV SOLUTION) ×1 IMPLANT

## 2012-06-01 NOTE — Anesthesia Preprocedure Evaluation (Addendum)
Anesthesia Evaluation  Patient identified by MRN, date of birth, ID band Patient awake    Reviewed: Allergy & Precautions, H&P , NPO status , Patient's Chart, lab work & pertinent test results  Airway Mallampati: II TM Distance: >3 FB Neck ROM: full    Dental  (+) Edentulous Upper, Edentulous Lower and Dental Advisory Given   Pulmonary neg pulmonary ROS, asthma , COPD COPD inhaler,  breath sounds clear to auscultation  Pulmonary exam normal       Cardiovascular Exercise Tolerance: Good negative cardio ROS  Rhythm:regular Rate:Normal     Neuro/Psych Depression Schizophrenia negative neurological ROS  negative psych ROS   GI/Hepatic negative GI ROS, Neg liver ROS, GERD-  Medicated and Controlled,  Endo/Other  negative endocrine ROS  Renal/GU negative Renal ROS  negative genitourinary   Musculoskeletal   Abdominal   Peds  Hematology negative hematology ROS (+)   Anesthesia Other Findings   Reproductive/Obstetrics negative OB ROS                          Anesthesia Physical Anesthesia Plan  ASA: III  Anesthesia Plan: General   Post-op Pain Management:    Induction: Intravenous  Airway Management Planned: LMA  Additional Equipment:   Intra-op Plan:   Post-operative Plan:   Informed Consent: I have reviewed the patients History and Physical, chart, labs and discussed the procedure including the risks, benefits and alternatives for the proposed anesthesia with the patient or authorized representative who has indicated his/her understanding and acceptance.   Dental Advisory Given  Plan Discussed with: CRNA and Surgeon  Anesthesia Plan Comments:         Anesthesia Quick Evaluation

## 2012-06-01 NOTE — H&P (Signed)
History and Physical  Chief Complaint: Inability to retract foreskin.  History of Present Illness: [name] is a resident of [facility] who has been having dysuria for over a year.  He has not been circumcised and can not retract his foreskin.  Retraction of the foreskin is painful.  He has a history of COPD and generalized muscle weakness.   He is scheduled today for circumcision    Past Medical History  Diagnosis Date  . History of psychosis LAST DOCMENTATION ADMISSION  FOR PSYCHOSIS/ DEPRESSION  WITH SCHIZOPHRENIA   02-25-2010  . COPD (chronic obstructive pulmonary disease)   . Asthma   . OA (osteoarthritis)   . Personal history of schizophrenia   . Depression   . Phimosis SEVERE  . Penile pain SECONDARY TO SEVERE PHIMOSIS  . Wheelchair dependent CAN PIVET W/ ASSIST  . GERD (gastroesophageal reflux disease)   . Iron deficiency anemia   . Headache   . Dyslipidemia    Past Surgical History  Procedure Date  . Total hip arthroplasty 11-15-2001  DR DALLDORF    RIGHT     Medications: Aspirin, Citalopram, Ferrous sulfate, Folic acid, Nitrostat, Olanzapine, Omeprazole, Spiriva, Vit B-6 , Vit D. Allergies:  Allergies  Allergen Reactions  . Penicillins Rash    History reviewed. No pertinent family history. Social History:  reports that he has been smoking Cigarettes.  He has been smoking about .25 packs per day. He does not have any smokeless tobacco history on file. He reports that he does not drink alcohol. His drug history not on file.  ROS: All systems are reviewed and negative except as noted.  Physical Exam:  Vital signs in last 24 hours:  ENT: Within normal limits Neck: Supple. No cervical adenopathy.  No thyromegaly. Cardiovascular: Skin warm; not flushed Respiratory: Breaths quiet; no shortness of breath Abdomen: No masses Neurological: Normal sensation to touch Musculoskeletal: Normal motor function arms and legs Lymphatics: No inguinal adenopathy Skin:  No rashes Genitourinary:Penis: uncircumcised. Testicles are normal.  Laboratory Data:  No results found for this or any previous visit (from the past 24 hour(s)). No results found for this or any previous visit (from the past 240 hour(s)). Creatinine: No results found for this basename: CREATININE:7 in the last 168 hours     Impression/Assessment:  Phimosis  Plan:  Circumcision  Min Collymore-HENRY 06/01/2012, 9:32 AM

## 2012-06-01 NOTE — Op Note (Signed)
Chase Benjamin is a 78 y.o.   06/01/2012  Pre-Op Diagnosis: Phimosis  Post-Op Diagnosis: Same  Procedure done: Circumcision  Surgeon: Chase Benjamin  Anesthesia: General  Indication: The patient is a 78 years old male with a year history of difficulty retracting his foreskin.  He has to strain to urinate.  On physical examination he was found to have severe phimosis.  He is scheduled for circumcision.  Procedure: The patient was identified by his wrist band and proper time out was taken.  Under general anesthesia the patient was prepped and draped and placed in the supine position.  A penile block was done with 0.25% Marcaine.  The foreskin was severely phimotic.  A dorsal and a ventral slits were done.  The foreskin in between the slits was excised.  Hemostasis was secured with electrocautery.  Skin approximation was done with # 0000 chromic.  Sterile dressing was then applied to the wound.  EBL: Minimal  Needles, sponges count: Correct  The patient tolerated the procedure well and left the OR in satisfactory condition to PACU.

## 2012-06-01 NOTE — Anesthesia Procedure Notes (Signed)
Procedure Name: LMA Insertion Date/Time: 06/01/2012 10:48 AM Performed by: Norva Pavlov Pre-anesthesia Checklist: Patient identified, Emergency Drugs available, Suction available and Patient being monitored Patient Re-evaluated:Patient Re-evaluated prior to inductionOxygen Delivery Method: Circle System Utilized Preoxygenation: Pre-oxygenation with 100% oxygen Intubation Type: IV induction Ventilation: Mask ventilation without difficulty LMA: LMA inserted LMA Size: 4.0 Number of attempts: 1 Airway Equipment and Method: bite block Placement Confirmation: positive ETCO2 Tube secured with: Tape Dental Injury: Teeth and Oropharynx as per pre-operative assessment

## 2012-06-01 NOTE — Transfer of Care (Signed)
Immediate Anesthesia Transfer of Care Note  Patient: Chase Benjamin  Procedure(s) Performed: Procedure(s) (LRB): CIRCUMCISION ADULT (N/A)  Patient Location: PACU  Anesthesia Type: General  Level of Consciousness: sleepy  Airway & Oxygen Therapy: Patient Spontanous Breathing and Patient connected to face mask oxygen  Post-op Assessment: Report given to PACU RN and Post -op Vital signs reviewed and stable  Post vital signs: Reviewed and stable  Complications: No apparent anesthesia complications

## 2012-06-01 NOTE — Anesthesia Postprocedure Evaluation (Signed)
  Anesthesia Post-op Note  Patient: Chase Benjamin  Procedure(s) Performed: Procedure(s) (LRB): CIRCUMCISION ADULT (N/A)  Patient Location: PACU  Anesthesia Type: General  Level of Consciousness: awake and alert   Airway and Oxygen Therapy: Patient Spontanous Breathing  Post-op Pain: mild  Post-op Assessment: Post-op Vital signs reviewed, Patient's Cardiovascular Status Stable, Respiratory Function Stable, Patent Airway and No signs of Nausea or vomiting  Last Vitals:  Filed Vitals:   06/01/12 1135  BP: 144/85  Pulse:   Temp: 36.5 C  Resp: 14    Post-op Vital Signs: stable   Complications: No apparent anesthesia complications

## 2012-06-04 ENCOUNTER — Encounter (HOSPITAL_BASED_OUTPATIENT_CLINIC_OR_DEPARTMENT_OTHER): Payer: Self-pay | Admitting: Urology

## 2012-08-16 NOTE — Addendum Note (Signed)
Addendum created 08/16/12 1342 by Gaetano Hawthorne, MD   Modules edited: Anesthesia Responsible Staff

## 2012-09-24 ENCOUNTER — Encounter (HOSPITAL_COMMUNITY): Payer: Self-pay | Admitting: Emergency Medicine

## 2012-09-24 ENCOUNTER — Emergency Department (HOSPITAL_COMMUNITY): Payer: Medicare Other

## 2012-09-24 ENCOUNTER — Emergency Department (HOSPITAL_COMMUNITY)
Admission: EM | Admit: 2012-09-24 | Discharge: 2012-09-24 | Disposition: A | Discharge status: Skilled Nursing Facility | Payer: Medicare Other | Attending: Emergency Medicine | Admitting: Emergency Medicine

## 2012-09-24 DIAGNOSIS — Z8659 Personal history of other mental and behavioral disorders: Secondary | ICD-10-CM | POA: Insufficient documentation

## 2012-09-24 DIAGNOSIS — Z993 Dependence on wheelchair: Secondary | ICD-10-CM | POA: Insufficient documentation

## 2012-09-24 DIAGNOSIS — Z87448 Personal history of other diseases of urinary system: Secondary | ICD-10-CM | POA: Insufficient documentation

## 2012-09-24 DIAGNOSIS — Z7982 Long term (current) use of aspirin: Secondary | ICD-10-CM | POA: Insufficient documentation

## 2012-09-24 DIAGNOSIS — J4489 Other specified chronic obstructive pulmonary disease: Secondary | ICD-10-CM | POA: Insufficient documentation

## 2012-09-24 DIAGNOSIS — F17200 Nicotine dependence, unspecified, uncomplicated: Secondary | ICD-10-CM | POA: Insufficient documentation

## 2012-09-24 DIAGNOSIS — F3289 Other specified depressive episodes: Secondary | ICD-10-CM | POA: Insufficient documentation

## 2012-09-24 DIAGNOSIS — Z880 Allergy status to penicillin: Secondary | ICD-10-CM | POA: Insufficient documentation

## 2012-09-24 DIAGNOSIS — F329 Major depressive disorder, single episode, unspecified: Secondary | ICD-10-CM | POA: Insufficient documentation

## 2012-09-24 DIAGNOSIS — K219 Gastro-esophageal reflux disease without esophagitis: Secondary | ICD-10-CM | POA: Insufficient documentation

## 2012-09-24 DIAGNOSIS — M25559 Pain in unspecified hip: Secondary | ICD-10-CM | POA: Insufficient documentation

## 2012-09-24 DIAGNOSIS — M1990 Unspecified osteoarthritis, unspecified site: Secondary | ICD-10-CM | POA: Insufficient documentation

## 2012-09-24 DIAGNOSIS — Z8639 Personal history of other endocrine, nutritional and metabolic disease: Secondary | ICD-10-CM | POA: Insufficient documentation

## 2012-09-24 DIAGNOSIS — D509 Iron deficiency anemia, unspecified: Secondary | ICD-10-CM | POA: Insufficient documentation

## 2012-09-24 DIAGNOSIS — Z79899 Other long term (current) drug therapy: Secondary | ICD-10-CM | POA: Insufficient documentation

## 2012-09-24 DIAGNOSIS — Z862 Personal history of diseases of the blood and blood-forming organs and certain disorders involving the immune mechanism: Secondary | ICD-10-CM | POA: Insufficient documentation

## 2012-09-24 DIAGNOSIS — J449 Chronic obstructive pulmonary disease, unspecified: Secondary | ICD-10-CM | POA: Insufficient documentation

## 2012-09-24 MED ORDER — TRAMADOL HCL 50 MG PO TABS: 50.0000 mg | ORAL_TABLET | Freq: Four times a day (QID) | ORAL | Status: DC | PRN

## 2012-09-24 NOTE — ED Provider Notes (Signed)
History   CSN: [id number] Arrival date & time 09/24/12  1425 First MD Initiated Contact with Patient 09/24/12 1551     Chief Complaint  Patient presents with  . Leg Pain  . Hip Pain    HPI Patient presents to the emergency room with complaints of right hip pain. Symptoms started sometime last week. He had outpatient x-rays done last Thursday and apparently there is no acute abnormalities noted. The patient continued to have pain in his hip said he was sent to the emergency room for evaluation. Patient has not had any recent trauma. He does have history of prior hip surgery for arthritis in that right hip. Pain is moderate and increases with movement. He has not had any fever. He has not noticed any rashes. The patient uses a wheelchair to move around but can usually stand in transfer.  Past Medical History  Diagnosis Date  . History of psychosis LAST DOCMENTATION ADMISSION  FOR PSYCHOSIS/ DEPRESSION  WITH SCHIZOPHRENIA   02-25-2010  . COPD (chronic obstructive pulmonary disease)   . Asthma   . OA (osteoarthritis)   . Personal history of schizophrenia   . Depression   . Phimosis SEVERE  . Penile pain SECONDARY TO SEVERE PHIMOSIS  . Wheelchair dependent CAN PIVET W/ ASSIST  . GERD (gastroesophageal reflux disease)   . Iron deficiency anemia   . Headache   . Dyslipidemia     Past Surgical History  Procedure Laterality Date  . Total hip arthroplasty  11-15-2001  DR [facility]    RIGHT   . Circumcision  06/01/2012    Procedure: CIRCUMCISION ADULT;  Surgeon: Lindaann Slough, MD;  Location: [facility];  Service: Urology;  Laterality: N/A;        History reviewed. No pertinent family history.  History  Substance Use Topics  . Smoking status: Current Every Day Smoker -- 0.25 packs/day    Types: Cigarettes  . Smokeless tobacco: Not on file  . Alcohol Use: No      Review of Systems  All other systems reviewed and are negative.    Allergies   Penicillins  Home Medications   Current Outpatient Rx  Name  Route  Sig  Dispense  Refill  . acetaminophen (TYLENOL) 500 MG tablet   Oral   Take 1,000 mg by mouth 2 (two) times daily.          Marland Kitchen aspirin EC 81 MG tablet   Oral   Take 81 mg by mouth daily.         . Camphor-Eucalyptus-Menthol (VICKS VAPORUB EX)   Topical   Apply 1 application topically at bedtime. Applied to toes at night for fungal infection.         . cholecalciferol (VITAMIN D) 1000 UNITS tablet   Oral   Take 1,000 Units by mouth daily.         . citalopram (CELEXA) 20 MG tablet   Oral   Take 20 mg by mouth daily.          . fenofibrate micronized (LOFIBRA) 200 MG capsule   Oral   Take 200 mg by mouth daily before breakfast.         . ferrous sulfate 325 (65 FE) MG EC tablet   Oral   Take 325 mg by mouth every morning.         . folic acid (FOLVITE) 800 MCG tablet   Oral   Take 2,400 mcg by mouth every morning.         Marland Kitchen  guaifenesin (ROBITUSSIN) 100 MG/5ML syrup   Oral   Take 200 mg by mouth every 4 (four) hours as needed for cough.         Marland Kitchen HYDROcodone-acetaminophen (NORCO/VICODIN) 5-325 MG per tablet   Oral   Take 1 tablet by mouth every 4 (four) hours as needed for pain.         Marland Kitchen neomycin-bacitracin-polymyxin (NEOSPORIN) ointment   Topical   Apply 1 application topically 3 (three) times daily. Applied to foreskin.         Marland Kitchen nitroGLYCERIN (NITROSTAT) 0.4 MG SL tablet   Sublingual   Place 0.4 mg under the tongue every 5 (five) minutes as needed for chest pain.          Marland Kitchen OLANZapine (ZYPREXA) 2.5 MG tablet   Oral   Take 2.5 mg by mouth every morning.         Marland Kitchen omeprazole (PRILOSEC) 40 MG capsule   Oral   Take 40 mg by mouth daily.          Marland Kitchen pyridOXINE (VITAMIN B-6) 50 MG tablet   Oral   Take 25 mg by mouth daily.         . SUMAtriptan (IMITREX) 50 MG tablet   Oral   Take 50 mg by mouth every 2 (two) hours as needed for migraine.         .  tiotropium (SPIRIVA) 18 MCG inhalation capsule   Inhalation   Place 18 mcg into inhaler and inhale every morning.         . traMADol (ULTRAM) 50 MG tablet   Oral   Take 1 tablet (50 mg total) by mouth every 6 (six) hours as needed for pain.   15 tablet   0     BP 129/72  Pulse 70  Temp(Src) 97.9 F (36.6 C) (Oral)  Resp 16  SpO2 94%  Physical Exam  Nursing note and vitals reviewed. Constitutional: He appears well-developed and well-nourished. No distress.  HENT:  Head: Normocephalic and atraumatic.  Right Ear: External ear normal.  Left Ear: External ear normal.  Eyes: Conjunctivae are normal. Right eye exhibits no discharge. Left eye exhibits no discharge. No scleral icterus.  Neck: Neck supple. No tracheal deviation present.  Cardiovascular: Normal rate, regular rhythm and normal heart sounds.   Pulmonary/Chest: Effort normal and breath sounds normal. No stridor. No respiratory distress.  Musculoskeletal: He exhibits no edema.       Right hip: He exhibits tenderness and bony tenderness. He exhibits no swelling, no deformity and no laceration.       Right knee: Normal.       Right ankle: Normal.  Neurological: He is alert. Cranial nerve deficit: no gross deficits.  Skin: Skin is warm and dry. No rash noted.  Psychiatric: He has a normal mood and affect.    ED Course  Procedures (including critical care time)  Labs Reviewed - No data to display Dg Hip Complete Right  09/24/2012  *RADIOLOGY REPORT*  Clinical Data: Right hip pain, no injury  RIGHT HIP - COMPLETE 2+ VIEW  Comparison: None.  Findings: Total hip replacement on the right in satisfactory position and alignment.  No acute fracture.  Mild degenerative change left hip joint.  IMPRESSION: No acute abnormality.   Original Report Authenticated By: Janeece Riggers, M.D.     1. Osteoarthritis     MDM  No signs of acute infection or injury.  Doubt occult fracture no trauma history.  Recc outpatient  follow up with his  orthopedic surgeon.        Celene Kras, MD 09/24/12 [phone]

## 2012-09-24 NOTE — ED Notes (Signed)
Bed:WHALA<BR> Expected date:<BR> Expected time:<BR> Means of arrival:<BR> Comments:<BR> Leg pain

## 2012-09-24 NOTE — ED Notes (Signed)
[facility] called  

## 2012-09-24 NOTE — ED Notes (Signed)
Per EMS-pt from [facility] with c/o of right leg and hip pain, more pain upon movement. X-ray done on Thursday no trauma. NAD at this time.

## 2013-06-21 ENCOUNTER — Emergency Department (HOSPITAL_COMMUNITY): Payer: Medicare Other

## 2013-06-21 ENCOUNTER — Encounter (HOSPITAL_COMMUNITY): Payer: Self-pay | Admitting: Emergency Medicine

## 2013-06-21 ENCOUNTER — Emergency Department (HOSPITAL_COMMUNITY)
Admission: EM | Admit: 2013-06-21 | Discharge: 2013-06-21 | Disposition: A | Discharge status: Home / Self Care | Payer: Medicare Other | Attending: Emergency Medicine | Admitting: Emergency Medicine

## 2013-06-21 DIAGNOSIS — M1990 Unspecified osteoarthritis, unspecified site: Secondary | ICD-10-CM | POA: Insufficient documentation

## 2013-06-21 DIAGNOSIS — J449 Chronic obstructive pulmonary disease, unspecified: Secondary | ICD-10-CM | POA: Insufficient documentation

## 2013-06-21 DIAGNOSIS — D509 Iron deficiency anemia, unspecified: Secondary | ICD-10-CM | POA: Insufficient documentation

## 2013-06-21 DIAGNOSIS — Y939 Activity, unspecified: Secondary | ICD-10-CM | POA: Insufficient documentation

## 2013-06-21 DIAGNOSIS — Y929 Unspecified place or not applicable: Secondary | ICD-10-CM | POA: Insufficient documentation

## 2013-06-21 DIAGNOSIS — F17200 Nicotine dependence, unspecified, uncomplicated: Secondary | ICD-10-CM | POA: Insufficient documentation

## 2013-06-21 DIAGNOSIS — Z8669 Personal history of other diseases of the nervous system and sense organs: Secondary | ICD-10-CM | POA: Insufficient documentation

## 2013-06-21 DIAGNOSIS — Z7982 Long term (current) use of aspirin: Secondary | ICD-10-CM | POA: Insufficient documentation

## 2013-06-21 DIAGNOSIS — Z87448 Personal history of other diseases of urinary system: Secondary | ICD-10-CM | POA: Insufficient documentation

## 2013-06-21 DIAGNOSIS — E785 Hyperlipidemia, unspecified: Secondary | ICD-10-CM | POA: Insufficient documentation

## 2013-06-21 DIAGNOSIS — J45909 Unspecified asthma, uncomplicated: Secondary | ICD-10-CM | POA: Insufficient documentation

## 2013-06-21 DIAGNOSIS — F3289 Other specified depressive episodes: Secondary | ICD-10-CM | POA: Insufficient documentation

## 2013-06-21 DIAGNOSIS — S32009A Unspecified fracture of unspecified lumbar vertebra, initial encounter for closed fracture: Secondary | ICD-10-CM | POA: Insufficient documentation

## 2013-06-21 DIAGNOSIS — R109 Unspecified abdominal pain: Secondary | ICD-10-CM | POA: Insufficient documentation

## 2013-06-21 DIAGNOSIS — S32000A Wedge compression fracture of unspecified lumbar vertebra, initial encounter for closed fracture: Secondary | ICD-10-CM

## 2013-06-21 DIAGNOSIS — Z880 Allergy status to penicillin: Secondary | ICD-10-CM | POA: Insufficient documentation

## 2013-06-21 DIAGNOSIS — Z79899 Other long term (current) drug therapy: Secondary | ICD-10-CM | POA: Insufficient documentation

## 2013-06-21 DIAGNOSIS — Z993 Dependence on wheelchair: Secondary | ICD-10-CM | POA: Insufficient documentation

## 2013-06-21 DIAGNOSIS — F329 Major depressive disorder, single episode, unspecified: Secondary | ICD-10-CM | POA: Insufficient documentation

## 2013-06-21 DIAGNOSIS — J4489 Other specified chronic obstructive pulmonary disease: Secondary | ICD-10-CM | POA: Insufficient documentation

## 2013-06-21 DIAGNOSIS — Z8659 Personal history of other mental and behavioral disorders: Secondary | ICD-10-CM | POA: Insufficient documentation

## 2013-06-21 DIAGNOSIS — K219 Gastro-esophageal reflux disease without esophagitis: Secondary | ICD-10-CM | POA: Insufficient documentation

## 2013-06-21 DIAGNOSIS — X58XXXA Exposure to other specified factors, initial encounter: Secondary | ICD-10-CM | POA: Insufficient documentation

## 2013-06-21 DIAGNOSIS — K439 Ventral hernia without obstruction or gangrene: Secondary | ICD-10-CM | POA: Insufficient documentation

## 2013-06-21 LAB — CBC WITH DIFFERENTIAL/PLATELET
BASOS PCT: 2 % — AB (ref 0–1)
Basophils Absolute: 0.1 10*3/uL (ref 0.0–0.1)
EOS PCT: 3 % (ref 0–5)
Eosinophils Absolute: 0.2 10*3/uL (ref 0.0–0.7)
HEMATOCRIT: 38.3 % — AB (ref 39.0–52.0)
HEMOGLOBIN: 13.6 g/dL (ref 13.0–17.0)
LYMPHS ABS: 1.5 10*3/uL (ref 0.7–4.0)
Lymphocytes Relative: 28 % (ref 12–46)
MCH: 39.1 pg — AB (ref 26.0–34.0)
MCHC: 35.5 g/dL (ref 30.0–36.0)
MCV: 110.1 fL — AB (ref 78.0–100.0)
MONO ABS: 0.6 10*3/uL (ref 0.1–1.0)
Monocytes Relative: 11 % (ref 3–12)
NEUTROS ABS: 3 10*3/uL (ref 1.7–7.7)
Neutrophils Relative %: 56 % (ref 43–77)
PLATELETS: 224 10*3/uL (ref 150–400)
RBC: 3.48 MIL/uL — AB (ref 4.22–5.81)
RDW: 12.6 % (ref 11.5–15.5)
WBC: 5.4 10*3/uL (ref 4.0–10.5)

## 2013-06-21 LAB — URINALYSIS, ROUTINE W REFLEX MICROSCOPIC
Bilirubin Urine: NEGATIVE
Glucose, UA: NEGATIVE mg/dL
Hgb urine dipstick: NEGATIVE
KETONES UR: NEGATIVE mg/dL
LEUKOCYTES UA: NEGATIVE
Nitrite: NEGATIVE
PROTEIN: NEGATIVE mg/dL
Specific Gravity, Urine: 1.017 (ref 1.005–1.030)
UROBILINOGEN UA: 1 mg/dL (ref 0.0–1.0)
pH: 7 (ref 5.0–8.0)

## 2013-06-21 LAB — COMPREHENSIVE METABOLIC PANEL
ALT: 8 U/L (ref 0–53)
AST: 25 U/L (ref 0–37)
Albumin: 3.4 g/dL — ABNORMAL LOW (ref 3.5–5.2)
Alkaline Phosphatase: 55 U/L (ref 39–117)
BILIRUBIN TOTAL: 0.3 mg/dL (ref 0.3–1.2)
BUN: 17 mg/dL (ref 6–23)
CALCIUM: 9.1 mg/dL (ref 8.4–10.5)
CO2: 24 meq/L (ref 19–32)
CREATININE: 1.58 mg/dL — AB (ref 0.50–1.35)
Chloride: 103 mEq/L (ref 96–112)
GFR, EST AFRICAN AMERICAN: 46 mL/min — AB (ref >90)
GFR, EST NON AFRICAN AMERICAN: 40 mL/min — AB (ref >90)
GLUCOSE: 89 mg/dL (ref 70–99)
Potassium: 4.8 mEq/L (ref 3.7–5.3)
Sodium: 138 mEq/L (ref 137–147)
Total Protein: 6.9 g/dL (ref 6.0–8.3)

## 2013-06-21 LAB — LIPASE, BLOOD: Lipase: 9 U/L — ABNORMAL LOW (ref 11–59)

## 2013-06-21 LAB — CG4 I-STAT (LACTIC ACID): LACTIC ACID, VENOUS: 1.27 mmol/L (ref 0.5–2.2)

## 2013-06-21 MED ORDER — MORPHINE SULFATE 4 MG/ML IJ SOLN
4.0000 mg | Freq: Once | INTRAMUSCULAR | Status: AC
  Administered 2013-06-21: 4 mg via INTRAVENOUS
  Filled 2013-06-21: qty 1

## 2013-06-21 MED ORDER — IOHEXOL 300 MG/ML  SOLN: 25.0000 mL | Freq: Once | INTRAMUSCULAR | Status: DC | PRN

## 2013-06-21 MED ORDER — IOHEXOL 300 MG/ML  SOLN
80.0000 mL | Freq: Once | INTRAMUSCULAR | Status: AC | PRN
  Administered 2013-06-21: 80 mL via INTRAVENOUS

## 2013-06-21 NOTE — ED Provider Notes (Signed)
CSN: [id number]     Arrival date & time 06/21/13  1001 History   First MD Initiated Contact with Patient 06/21/13 1004     Chief Complaint  Patient presents with  . Back Pain   (Consider location/radiation/quality/duration/timing/severity/associated sxs/prior Treatment) HPI Comments: 80 year old male presents with 2-3 weeks of low back pain. States it started without any trauma. He does not know why it started. It has been getting progressively worse. Has been taking hydrocodone at the assisted living facility but is not seeming to help. He denies any fevers, leg weakness, leg numbness, or urinary symptoms. He points to his lower abdomen and a wrapping around fashion as where his pain is. When asked about abdominal pain he states she's not have any. He has not had any nausea vomiting or diarrhea. He states the pain is a 10 out of 10.   Past Medical History  Diagnosis Date  . History of psychosis LAST DOCMENTATION ADMISSION  FOR PSYCHOSIS/ DEPRESSION  WITH SCHIZOPHRENIA   02-25-2010  . COPD (chronic obstructive pulmonary disease)   . Asthma   . OA (osteoarthritis)   . Personal history of schizophrenia   . Depression   . Phimosis SEVERE  . Penile pain SECONDARY TO SEVERE PHIMOSIS  . Wheelchair dependent CAN PIVET W/ ASSIST  . GERD (gastroesophageal reflux disease)   . Iron deficiency anemia   . Headache(784.0)   . Dyslipidemia    Past Surgical History  Procedure Laterality Date  . [facility]    [facility]   . Circumcision  06/01/2012    Procedure: CIRCUMCISION ADULT;  Surgeon: Lindaann SloughMarc-Henry Nesi, MD;  [facility];  Service: Urology;  Laterality: N/A;       No family history on file. History  Substance Use Topics  . Smoking status: Current Every Day Smoker -- 0.25 packs/day    Types: Cigarettes  . Smokeless tobacco: Not on file  . Alcohol Use: No    Review of Systems  Constitutional: Negative for fever and chills.   Gastrointestinal: Negative for nausea, vomiting, abdominal pain and diarrhea.  Genitourinary: Negative for dysuria.  Musculoskeletal: Positive for back pain.  Neurological: Negative for weakness and numbness.  All other systems reviewed and are negative.    Allergies  Penicillins  Home Medications   Current Outpatient Rx  Name  Route  Sig  Dispense  Refill  . acetaminophen (TYLENOL) 500 MG tablet   Oral   Take 1,000 mg by mouth 2 (two) times daily.          Marland Kitchen. aspirin EC 81 MG tablet   Oral   Take 81 mg by mouth daily.         . Camphor-Eucalyptus-Menthol (VICKS VAPORUB EX)   Topical   Apply 1 application topically at bedtime. Applied to toes at night for fungal infection.         . cholecalciferol (VITAMIN D) 1000 UNITS tablet   Oral   Take 1,000 Units by mouth daily.         . citalopram (CELEXA) 20 MG tablet   Oral   Take 20 mg by mouth daily.          . fenofibrate micronized (LOFIBRA) 200 MG capsule   Oral   Take 200 mg by mouth daily before breakfast.         . folic acid (FOLVITE) 800 MCG tablet   Oral   Take 2,400 mcg by mouth every morning.         .Marland Kitchen  guaifenesin (ROBITUSSIN) 100 MG/5ML syrup   Oral   Take 200 mg by mouth every 4 (four) hours as needed for cough.         Marland Kitchen HYDROcodone-acetaminophen (NORCO/VICODIN) 5-325 MG per tablet   Oral   Take 1 tablet by mouth every 4 (four) hours as needed for pain.         Marland Kitchen neomycin-bacitracin-polymyxin (NEOSPORIN) ointment   Topical   Apply 1 application topically 3 (three) times daily. Applied to foreskin.         Marland Kitchen nitroGLYCERIN (NITROSTAT) 0.4 MG SL tablet   Sublingual   Place 0.4 mg under the tongue every 5 (five) minutes as needed for chest pain.          Marland Kitchen OLANZapine (ZYPREXA) 2.5 MG tablet   Oral   Take 2.5 mg by mouth every morning.         Marland Kitchen omeprazole (PRILOSEC) 40 MG capsule   Oral   Take 40 mg by mouth daily.          Marland Kitchen pyridOXINE (VITAMIN B-6) 50 MG tablet    Oral   Take 25 mg by mouth daily.         . SUMAtriptan (IMITREX) 50 MG tablet   Oral   Take 50 mg by mouth every 2 (two) hours as needed for migraine.         . tiotropium (SPIRIVA) 18 MCG inhalation capsule   Inhalation   Place 18 mcg into inhaler and inhale every morning.         . traMADol (ULTRAM) 50 MG tablet   Oral   Take 1 tablet (50 mg total) by mouth every 6 (six) hours as needed for pain.   15 tablet   0    BP 119/75  Pulse 79  Temp(Src) 98.6 F (37 C)  Resp 16  SpO2 16% Physical Exam  Nursing note and vitals reviewed. Constitutional: He is oriented to person, place, and time. He appears well-developed and well-nourished. No distress.  HENT:  Head: Normocephalic and atraumatic.  Right Ear: External ear normal.  Left Ear: External ear normal.  Nose: Nose normal.  Eyes: Right eye exhibits no discharge. Left eye exhibits no discharge.  Neck: Neck supple.  Cardiovascular: Normal rate, regular rhythm, normal heart sounds and intact distal pulses.   Pulmonary/Chest: Effort normal.  Abdominal: Soft. There is tenderness (mild) in the right lower quadrant. A hernia is present. Hernia confirmed positive in the ventral area (easily reducible).  Musculoskeletal: He exhibits no edema.       Lumbar back: He exhibits tenderness (midline and in bilateral lower back tenderness. No erythema or bruising).  Neurological: He is alert and oriented to person, place, and time. He has normal strength. No sensory deficit.  Skin: Skin is warm and dry.    ED Course  Procedures (including critical care time) Labs Review Labs Reviewed  CBC WITH DIFFERENTIAL - Abnormal; Notable for the following:    RBC 3.48 (*)    HCT 38.3 (*)    MCV 110.1 (*)    MCH 39.1 (*)    Basophils Relative 2 (*)    All other components within normal limits  COMPREHENSIVE METABOLIC PANEL - Abnormal; Notable for the following:    Creatinine, Ser 1.58 (*)    Albumin 3.4 (*)    GFR calc non Af Amer 40  (*)    GFR calc Af Amer 46 (*)    All other components within normal limits  LIPASE, BLOOD - Abnormal; Notable for the following:    Lipase 9 (*)    All other components within normal limits  URINALYSIS, ROUTINE W REFLEX MICROSCOPIC  CG4 I-STAT (LACTIC ACID)   Imaging Review Dg Lumbar Spine Complete  06/21/2013   CLINICAL DATA:  80 year old male with back pain. Initial encounter.  EXAM: LUMBAR SPINE - COMPLETE 4+ VIEW  COMPARISON:  03/03/2007.  FINDINGS: Osteopenia. Transitional anatomy with sacralized L5 level. Same numbering system as in 2008.  Mild dextro convex scoliosis which is new. The L1 level demonstrates subtle wedging and loss of height compared to 2008. Height of other lumbar vertebrae appear stable. Grossly intact visualized lower thoracic levels.  Chronic coarsened trabecular pattern throughout L5 an the visible sacrum appears stable. The right SI joint appears more indistinct now. Left SI joint appear stable. Chronic right bipolar hip arthroplasty. Chronic vascular appearing calcifications at the level of the right renal hilum.  IMPRESSION: 1. Osteopenia.  Age-indeterminate mild L1 compression fracture. If specific therapy such as vertebroplasty is desired, lumbar MRI or whole-body bone scan would best determine acuity. 2. Otherwise stable lumbar spine. 3. Sacralized L5 level which demonstrates chronic increased coarse trabecular pattern along with the rest of the sacrum. Favor chronic Paget's disease, and this may have lead to right SI joint ankylosis.   Electronically Signed   By: Augusto Gamble M.D.   On: 06/21/2013 11:15   Ct Abdomen Pelvis W Contrast  06/21/2013   CLINICAL DATA:  80 year old male with low back pain. Right lower quadrant abdominal pain. Initial encounter.  EXAM: CT ABDOMEN AND PELVIS WITH CONTRAST  TECHNIQUE: Multidetector CT imaging of the abdomen and pelvis was performed using the standard protocol following bolus administration of intravenous contrast.  CONTRAST:  80mL  OMNIPAQUE IOHEXOL 300 MG/ML  SOLN  COMPARISON:  Lumbar radiographs 06/21/2013 and earlier.  FINDINGS: Emphysema and architectural distortion at the right lung base with bronchiectasis and scarring. I comparatively mild bronchiectasis and scarring at the left lung base. No pleural or pericardial effusion. Calcified coronary artery atherosclerosis.  Osteopenia. Multilevel lumbar compression fractures, with transitional anatomy at L5-S1. No acute fracture plane evident at the L1 level. Abnormal bone mineralization in the sacrum and pelvis compatible with chronic Paget's disease. Sequelae of right hip arthroplasty.  No pelvic free fluid. Mild to moderately distended bladder. Evidence of a fundus bladder diverticulum (series 2, image 70 and sagittal image 68). No perivesical stranding.  Negative distal colon except for redundancy. Occasional diverticula. Negative left colon and transverse colon. Redundant hepatic flexure. Oral contrast has reached the ascending colon. The cecum is on a lax mesenteries a but otherwise unremarkable. Normal appendix (series 2, image 57). Negative terminal ileum. No dilated small bowel. Negative stomach and duodenum.  The gallbladder is distended, but no cholelithiasis or pericholecystic inflammation is evident by CT. Negative bladder, with a subcentimeter mild hypodense focus in the dome, too small to characterize but likely benign (series 2, image 14). Negative spleen and adrenal glands.  Fatty atrophy of the pancreas with dystrophic calcification of the head and uncinate process. No lesser sac inflammation.  Negative kidneys and ureters. Portal venous system within normal limits. Major arterial structures in the abdomen and pelvis are patent with Aortoiliac calcified atherosclerosis noted. Ectasia. And no focal inflammatory stranding identified. No abdominal free fluid or free air.  IMPRESSION: 1. No acute or inflammatory process identified in the abdomen or pelvis. 2. Chronic calcific  pancreatitis. Emphysema, bronchiectasis and lung base architectural distortion. Sacrum and pelvis  Paget's disease. 3. Multilevel lumbar compression fractures, do not appear acute by CT. If there is back pain and specific therapy such as vertebroplasty is desired, lumbar MRI or whole-body bone scan would best determine acuity.   Electronically Signed   By: Augusto Gamble M.D.   On: 06/21/2013 13:07    EKG Interpretation   None       MDM   1. Lumbar compression fracture    Patient has normal strength while in bed, able to lift legs of bed equally and has no neurologic deficits. Mild abd tenderness so CT obtained. No acute intra-abd pathology, but does have mild compression fractures as above. His pain has significantly improved with one does of IV morphine here. Discussed with Dr. Jeral Fruit of [facility], who viewed images and feels the fractures of mild and with no retropulsion, patient being wheelchair bound and almost 82 that there is no indication for treatment, including no sugery or brace (esp given his symptoms for almost 3 weeks). No MRI indicated at this time. Has narcotics at facility, will discharge back.     Audree Camel, MD 06/21/13 1710

## 2013-06-21 NOTE — ED Notes (Signed)
Lower Back pain x 2 -3 weeks denies dysuria or injury pt is in w/c most of day denies abd pain

## 2013-06-21 NOTE — Discharge Instructions (Signed)
Back, Compression Fracture °A compression fracture happens when a force is put upon the length of your spine. Slipping and falling on your bottom are examples of such a force. When this happens, sometimes the force is great enough to compress the building blocks (vertebral bodies) of your spine. Although this causes a lot of pain, this can usually be treated at home, unless your caregiver feels hospitalization is needed for pain control. °Your backbone (spinal column) is made up of 24 main vertebral bodies in addition to the sacrum and coccyx (see illustration). These are held together by tough fibrous tissues (ligaments) and by support of your muscles. Nerve roots pass through the openings between the vertebrae. A sudden wrenching move, injury, or a fall may cause a compression fracture of one of the vertebral bodies. This may result in back pain or spread of pain into the belly (abdomen), the buttocks, and down the leg into the foot. Pain may also be created by muscle spasm alone. °Large studies have been undertaken to determine the best possible course of action to help your back following injury and also to prevent future problems. The recommendations are as follows. °FOLLOWING A COMPRESSION FRACTURE: °Do the following only if advised by your caregiver.  °· If a back brace has been suggested or provided, wear it as directed. °· DO NOT stop wearing the back brace unless instructed by your caregiver. °· When allowed to return to regular activities, avoid a sedentary life style. Actively exercise. Sporadic weekend binges of tennis, racquetball, water skiing, may actually aggravate or create problems, especially if you are not in condition for that activity. °· Avoid sports requiring sudden body movements until you are in condition for them. Swimming and walking are safer activities. °· Maintain good posture. °· Avoid obesity. °· If not already done, you should have a DEXA scan. Based on the results, be treated for  osteoporosis. °FOLLOWING ACUTE (SUDDEN) INJURY: °· Only take over-the-counter or prescription medicines for pain, discomfort, or fever as directed by your caregiver. °· Use bed rest for only the most extreme acute episode. Prolonged bed rest may aggravate your condition. Ice used for acute conditions is effective. Use a large plastic bag filled with ice. Wrap it in a towel. This also provides excellent pain relief. This may be continuous. Or use it for 30 minutes every 2 hours during acute phase, then as needed. Heat for 30 minutes prior to activities is helpful. °· As soon as the acute phase (the time when your back is too painful for you to do normal activities) is over, it is important to resume normal activities and work hardening programs. Back injuries can cause potentially marked changes in lifestyle. So it is important to attack these problems aggressively. °· See your caregiver for continued problems. He or she can help or refer you for appropriate exercises, physical therapy and work hardening if needed. °· If you are given narcotic medications for your condition, for the next 24 hours DO NOT: °· Drive °· Operate machinery or power tools. °· Sign legal documents. °· DO NOT drink alcohol, take sleeping pills or other medications that may interfere with treatment. °If your caregiver has given you a follow-up appointment, it is very important to keep that appointment. Not keeping the appointment could result in a chronic or permanent injury, pain, and disability. If there is any problem keeping the appointment, you must call back to this facility for assistance.  °SEEK IMMEDIATE MEDICAL CARE IF: °· You develop numbness,   tingling, weakness, or problems with the use of your arms or legs. °· You develop severe back pain not relieved with medications. °· You have changes in bowel or bladder control. °· You have increasing pain in any areas of the body. °Document Released: 05/23/2005 Document Revised: 08/15/2011  Document Reviewed: 12/26/2007 °[employer]® Patient Information ©2014 [employer]. ° °

## 2013-06-21 NOTE — ED Notes (Signed)
[facility] and f/u [facility].

## 2014-02-05 ENCOUNTER — Emergency Department (HOSPITAL_COMMUNITY): Payer: Medicare Other

## 2014-02-05 ENCOUNTER — Emergency Department (HOSPITAL_COMMUNITY)
Admission: EM | Admit: 2014-02-05 | Discharge: 2014-02-05 | Disposition: A | Discharge status: Home / Self Care | Payer: Medicare Other | Attending: Emergency Medicine | Admitting: Emergency Medicine

## 2014-02-05 ENCOUNTER — Encounter (HOSPITAL_COMMUNITY): Payer: Self-pay | Admitting: Emergency Medicine

## 2014-02-05 DIAGNOSIS — F17200 Nicotine dependence, unspecified, uncomplicated: Secondary | ICD-10-CM | POA: Insufficient documentation

## 2014-02-05 DIAGNOSIS — F329 Major depressive disorder, single episode, unspecified: Secondary | ICD-10-CM | POA: Insufficient documentation

## 2014-02-05 DIAGNOSIS — Z87448 Personal history of other diseases of urinary system: Secondary | ICD-10-CM | POA: Diagnosis not present

## 2014-02-05 DIAGNOSIS — M545 Low back pain, unspecified: Secondary | ICD-10-CM | POA: Insufficient documentation

## 2014-02-05 DIAGNOSIS — Z79899 Other long term (current) drug therapy: Secondary | ICD-10-CM | POA: Diagnosis not present

## 2014-02-05 DIAGNOSIS — M1990 Unspecified osteoarthritis, unspecified site: Secondary | ICD-10-CM | POA: Insufficient documentation

## 2014-02-05 DIAGNOSIS — N3000 Acute cystitis without hematuria: Secondary | ICD-10-CM | POA: Diagnosis not present

## 2014-02-05 DIAGNOSIS — K219 Gastro-esophageal reflux disease without esophagitis: Secondary | ICD-10-CM | POA: Diagnosis not present

## 2014-02-05 DIAGNOSIS — D509 Iron deficiency anemia, unspecified: Secondary | ICD-10-CM | POA: Diagnosis not present

## 2014-02-05 DIAGNOSIS — R319 Hematuria, unspecified: Secondary | ICD-10-CM | POA: Insufficient documentation

## 2014-02-05 DIAGNOSIS — J449 Chronic obstructive pulmonary disease, unspecified: Secondary | ICD-10-CM | POA: Diagnosis not present

## 2014-02-05 DIAGNOSIS — Z7982 Long term (current) use of aspirin: Secondary | ICD-10-CM | POA: Diagnosis not present

## 2014-02-05 DIAGNOSIS — F3289 Other specified depressive episodes: Secondary | ICD-10-CM | POA: Diagnosis not present

## 2014-02-05 DIAGNOSIS — J4489 Other specified chronic obstructive pulmonary disease: Secondary | ICD-10-CM | POA: Insufficient documentation

## 2014-02-05 DIAGNOSIS — Z880 Allergy status to penicillin: Secondary | ICD-10-CM | POA: Diagnosis not present

## 2014-02-05 LAB — CBC WITH DIFFERENTIAL/PLATELET
BASOS PCT: 1 % (ref 0–1)
Basophils Absolute: 0.1 10*3/uL (ref 0.0–0.1)
EOS ABS: 0.2 10*3/uL (ref 0.0–0.7)
EOS PCT: 4 % (ref 0–5)
HCT: 36.2 % — ABNORMAL LOW (ref 39.0–52.0)
HEMOGLOBIN: 13 g/dL (ref 13.0–17.0)
LYMPHS ABS: 1.7 10*3/uL (ref 0.7–4.0)
Lymphocytes Relative: 34 % (ref 12–46)
MCH: 39.6 pg — AB (ref 26.0–34.0)
MCHC: 35.9 g/dL (ref 30.0–36.0)
MCV: 110.4 fL — AB (ref 78.0–100.0)
MONO ABS: 0.7 10*3/uL (ref 0.1–1.0)
Monocytes Relative: 14 % — ABNORMAL HIGH (ref 3–12)
NEUTROS PCT: 47 % (ref 43–77)
Neutro Abs: 2.3 10*3/uL (ref 1.7–7.7)
Platelets: 210 10*3/uL (ref 150–400)
RBC: 3.28 MIL/uL — AB (ref 4.22–5.81)
RDW: 12.3 % (ref 11.5–15.5)
WBC: 5 10*3/uL (ref 4.0–10.5)

## 2014-02-05 LAB — BASIC METABOLIC PANEL
Anion gap: 14 (ref 5–15)
BUN: 23 mg/dL (ref 6–23)
CALCIUM: 9.2 mg/dL (ref 8.4–10.5)
CO2: 23 meq/L (ref 19–32)
CREATININE: 1.83 mg/dL — AB (ref 0.50–1.35)
Chloride: 101 mEq/L (ref 96–112)
GFR calc Af Amer: 39 mL/min — ABNORMAL LOW (ref >90)
GFR, EST NON AFRICAN AMERICAN: 33 mL/min — AB (ref >90)
GLUCOSE: 91 mg/dL (ref 70–99)
Potassium: 4.4 mEq/L (ref 3.7–5.3)
SODIUM: 138 meq/L (ref 137–147)

## 2014-02-05 LAB — URINALYSIS, ROUTINE W REFLEX MICROSCOPIC
BILIRUBIN URINE: NEGATIVE
Glucose, UA: NEGATIVE mg/dL
HGB URINE DIPSTICK: NEGATIVE
KETONES UR: NEGATIVE mg/dL
NITRITE: NEGATIVE
PH: 7 (ref 5.0–8.0)
Protein, ur: NEGATIVE mg/dL
Specific Gravity, Urine: 1.017 (ref 1.005–1.030)
UROBILINOGEN UA: 1 mg/dL (ref 0.0–1.0)

## 2014-02-05 LAB — URINE MICROSCOPIC-ADD ON

## 2014-02-05 MED ORDER — HYDROCODONE-ACETAMINOPHEN 5-325 MG PO TABS
2.0000 | ORAL_TABLET | Freq: Once | ORAL | Status: AC
  Administered 2014-02-05: 2 via ORAL
  Filled 2014-02-05: qty 2

## 2014-02-05 MED ORDER — SODIUM CHLORIDE 0.9 % IV BOLUS (SEPSIS)
1000.0000 mL | Freq: Once | INTRAVENOUS | Status: AC
  Administered 2014-02-05: 1000 mL via INTRAVENOUS

## 2014-02-05 MED ORDER — DEXTROSE 5 % IV SOLN
1.0000 g | Freq: Once | INTRAVENOUS | Status: AC
  Administered 2014-02-05: 1 g via INTRAVENOUS
  Filled 2014-02-05: qty 10

## 2014-02-05 MED ORDER — CIPROFLOXACIN HCL 500 MG PO TABS: 500.0000 mg | ORAL_TABLET | Freq: Two times a day (BID) | ORAL | Status: DC

## 2014-02-05 MED ORDER — MORPHINE SULFATE 2 MG/ML IJ SOLN
2.0000 mg | Freq: Once | INTRAMUSCULAR | Status: AC
  Administered 2014-02-05: 2 mg via INTRAVENOUS
  Filled 2014-02-05: qty 1

## 2014-02-05 NOTE — Discharge Instructions (Signed)
Continue taking Hydrocodone 1 pill every 4 hours as needed for pain.  Take ciprofloxacin  (1 pill) twice a day for 7 days or until finished with prescription.    Urinary Tract Infection Urinary tract infections (UTIs) can develop anywhere along your urinary tract. Your urinary tract is your body's drainage system for removing wastes and extra water. Your urinary tract includes two kidneys, two ureters, a bladder, and a urethra. Your kidneys are a pair of bean-shaped organs. Each kidney is about the size of your fist. They are located below your ribs, one on each side of your spine. CAUSES Infections are caused by microbes, which are microscopic organisms, including fungi, viruses, and bacteria. These organisms are so small that they can only be seen through a microscope. Bacteria are the microbes that most commonly cause UTIs. SYMPTOMS  Symptoms of UTIs may vary by age and gender of the patient and by the location of the infection. Symptoms in young women typically include a frequent and intense urge to urinate and a painful, burning feeling in the bladder or urethra during urination. Older women and men are more likely to be tired, shaky, and weak and have muscle aches and abdominal pain. A fever may mean the infection is in your kidneys. Other symptoms of a kidney infection include pain in your back or sides below the ribs, nausea, and vomiting. DIAGNOSIS To diagnose a UTI, your caregiver will ask you about your symptoms. Your caregiver also will ask to provide a urine sample. The urine sample will be tested for bacteria and white blood cells. White blood cells are made by your body to help fight infection. TREATMENT  Typically, UTIs can be treated with medication. Because most UTIs are caused by a bacterial infection, they usually can be treated with the use of antibiotics. The choice of antibiotic and length of treatment depend on your symptoms and the type of bacteria causing your  infection. HOME CARE INSTRUCTIONS  If you were prescribed antibiotics, take them exactly as your caregiver instructs you. Finish the medication even if you feel better after you have only taken some of the medication.  Drink enough water and fluids to keep your urine clear or pale yellow.  Avoid caffeine, tea, and carbonated beverages. They tend to irritate your bladder.  Empty your bladder often. Avoid holding urine for long periods of time.  Empty your bladder before and after sexual intercourse.  After a bowel movement, women should cleanse from front to back. Use each tissue only once. SEEK MEDICAL CARE IF:   You have back pain.  You develop a fever.  Your symptoms do not begin to resolve within 3 days. SEEK IMMEDIATE MEDICAL CARE IF:   You have severe back pain or lower abdominal pain.  You develop chills.  You have nausea or vomiting.  You have continued burning or discomfort with urination. MAKE SURE YOU:   Understand these instructions.  Will watch your condition.  Will get help right away if you are not doing well or get worse. Document Released: 03/02/2005 Document Revised: 11/22/2011 Document Reviewed: 07/01/2011 [facility] Patient Information 2015 [place], [place]. This information is not intended to replace advice given to you by your health care provider. Make sure you discuss any questions you have with your health care provider.   Back Pain, Adult Low back pain is very common. About 1 in 5 people have back pain.The cause of low back pain is rarely dangerous. The pain often gets better over time.About half  of people with a sudden onset of back pain feel better in just 2 weeks. About 8 in 10 people feel better by 6 weeks.  CAUSES Some common causes of back pain include:  Strain of the muscles or ligaments supporting the spine.  Wear and tear (degeneration) of the spinal discs.  Arthritis.  Direct injury to the back. DIAGNOSIS Most of the time, the  direct cause of low back pain is not known.However, back pain can be treated effectively even when the exact cause of the pain is unknown.Answering your caregiver's questions about your overall health and symptoms is one of the most accurate ways to make sure the cause of your pain is not dangerous. If your caregiver needs more information, he or she may order lab work or imaging tests (X-rays or MRIs).However, even if imaging tests show changes in your back, this usually does not require surgery. HOME CARE INSTRUCTIONS For many people, back pain returns.Since low back pain is rarely dangerous, it is often a condition that people can learn to [facility] their own.   Remain active. It is stressful on the back to sit or stand in one place. Do not sit, drive, or stand in one place for more than 30 minutes at a time. Take short walks on level surfaces as soon as pain allows.Try to increase the length of time you walk each day.  Do not stay in bed.Resting more than 1 or 2 days can delay your recovery.  Do not avoid exercise or work.Your body is made to move.It is not dangerous to be active, even though your back may hurt.Your back will likely heal faster if you return to being active before your pain is gone.  Pay attention to your body when you bend and lift. Many people have less discomfortwhen lifting if they bend their knees, keep the load close to their bodies,and avoid twisting. Often, the most comfortable positions are those that put less stress on your recovering back.  Find a comfortable position to sleep. Use a firm mattress and lie on your side with your knees slightly bent. If you lie on your back, put a pillow under your knees.  Only take over-the-counter or prescription medicines as directed by your caregiver. Over-the-counter medicines to reduce pain and inflammation are often the most helpful.Your caregiver may prescribe muscle relaxant drugs.These medicines help dull your pain  so you can more quickly return to your normal activities and healthy exercise.  Put ice on the injured area.  Put ice in a plastic bag.  Place a towel between your skin and the bag.  Leave the ice on for 15-20 minutes, 03-04 times a day for the first 2 to 3 days. After that, ice and heat may be alternated to reduce pain and spasms.  Ask your caregiver about trying back exercises and gentle massage. This may be of some benefit.  Avoid feeling anxious or stressed.Stress increases muscle tension and can worsen back pain.It is important to recognize when you are anxious or stressed and learn ways to manage it.Exercise is a great option. SEEK MEDICAL CARE IF:  You have pain that is not relieved with rest or medicine.  You have pain that does not improve in 1 week.  You have new symptoms.  You are generally not feeling well. SEEK IMMEDIATE MEDICAL CARE IF:   You have pain that radiates from your back into your legs.  You develop new bowel or bladder control problems.  You have unusual weakness or  numbness in your arms or legs.  You develop nausea or vomiting.  You develop abdominal pain.  You feel faint. Document Released: 05/23/2005 Document Revised: 11/22/2011 Document Reviewed: 09/24/2013 [facility] Patient Information 2015 [place], [place]. This information is not intended to replace advice given to you by your health care provider. Make sure you discuss any questions you have with your health care provider.

## 2014-02-05 NOTE — ED Notes (Signed)
[facility] here to transport pt to [facility].

## 2014-02-05 NOTE — ED Notes (Addendum)
Per EMS, pt complains of lower back and hip pain since yesterday. Staff state pt also had blood in his urine yesterday. Pt lives at [facility]. Pt A&Ox4.

## 2014-02-05 NOTE — ED Notes (Signed)
Bed: WA17 Expected date:  Expected time:  Means of arrival:  Comments: EMS back pain 

## 2014-02-05 NOTE — ED Notes (Signed)
Per EDP,Yao,MD., pt. Oxygen level decreased to 89%. Pt. Placed on 2 liters oxygen.

## 2014-02-05 NOTE — ED Provider Notes (Signed)
CSN: [id number]     Arrival date & time 02/05/14  1545 History   First MD Initiated Contact with Patient 02/05/14 1646     Chief Complaint  Patient presents with  . Back Pain  . Hematuria     (Consider location/radiation/quality/duration/timing/severity/associated sxs/prior Treatment) HPI [name] is a 80 year old male with past medical history of osteoarthritis, lumbar compression fracture who presents to the ER today with one day of lumbar back pain and hematuria. Patient states he was sitting still yesterday when his back pain began. Patient states his pain is a 10 out of 10, is constant when he is moving, however subside when he is lying still. Patient states lying on the stretcher he is not feeling any pain. He states the pain does not radiate, and does not have any associated numbness, tingling. Patient denies nausea, vomiting, fever, trauma to his back, shortness of breath, history of cancer, IV drug use. Patient reports past ER visit with identical lower back pain and diagnosis of lumbar compression fracture by CT abdomen pelvis in 06/2013.  Past Medical History  Diagnosis Date  . History of psychosis LAST DOCMENTATION ADMISSION  FOR PSYCHOSIS/ DEPRESSION  WITH SCHIZOPHRENIA   02-25-2010  . COPD (chronic obstructive pulmonary disease)   . Asthma   . OA (osteoarthritis)   . Personal history of schizophrenia   . Depression   . Phimosis SEVERE  . Penile pain SECONDARY TO SEVERE PHIMOSIS  . Wheelchair dependent CAN PIVET W/ ASSIST  . GERD (gastroesophageal reflux disease)   . Iron deficiency anemia   . Headache(784.0)   . Dyslipidemia    Past Surgical History  Procedure Laterality Date  . Total hip arthroplasty  11-15-2001  [facility]    RIGHT   . Circumcision  06/01/2012    Procedure: CIRCUMCISION ADULT;  Surgeon: Lindaann Slough, MD;  Location: [facility];  Service: Urology;  Laterality: N/A;       No family history on file. History  Substance Use Topics    . Smoking status: Current Every Day Smoker -- 0.25 packs/day    Types: Cigarettes  . Smokeless tobacco: Not on file  . Alcohol Use: No    Review of Systems  Constitutional: Negative for fever.  HENT: Negative for trouble swallowing.   Eyes: Negative for visual disturbance.  Respiratory: Negative for shortness of breath.   Cardiovascular: Negative for chest pain.  Gastrointestinal: Negative for nausea, vomiting and abdominal pain.  Genitourinary: Negative for dysuria and flank pain.  Musculoskeletal: Positive for back pain. Negative for neck pain.  Skin: Negative for rash.  Neurological: Negative for dizziness, weakness and numbness.  Psychiatric/Behavioral: Negative.       Allergies  Penicillins  Home Medications   Prior to Admission medications   Medication Sig Start Date End Date Taking? Authorizing Provider  acetaminophen (TYLENOL) 500 MG tablet Take 1,000 mg by mouth 2 (two) times daily.    Yes Historical Provider, MD  aspirin EC 81 MG tablet Take 81 mg by mouth daily.   Yes Historical Provider, MD  cholecalciferol (VITAMIN D) 1000 UNITS tablet Take 1,000 Units by mouth every morning.    Yes Historical Provider, MD  citalopram (CELEXA) 20 MG tablet Take 20 mg by mouth every morning.    Yes Historical Provider, MD  fenofibrate micronized (LOFIBRA) 200 MG capsule Take 200 mg by mouth daily before breakfast.   Yes Historical Provider, MD  ferrous sulfate 325 (65 FE) MG tablet Take 325 mg by mouth daily  with breakfast.   Yes Historical Provider, MD  folic acid (FOLVITE) 800 MCG tablet Take 2,400 mcg by mouth every morning.   Yes Historical Provider, MD  HYDROcodone-acetaminophen (NORCO/VICODIN) 5-325 MG per tablet Take 1 tablet by mouth every 4 (four) hours as needed for pain.   Yes Historical Provider, MD  Menthol, Topical Analgesic, (ICY HOT EX) Apply 1 patch topically 4 (four) times daily as needed (for pain).   Yes Historical Provider, MD  nitroGLYCERIN (NITROSTAT) 0.4 MG  SL tablet Place 0.4 mg under the tongue every 5 (five) minutes as needed for chest pain.    Yes Historical Provider, MD  OLANZapine (ZYPREXA) 2.5 MG tablet Take 2.5 mg by mouth every morning.   Yes Historical Provider, MD  omeprazole (PRILOSEC) 40 MG capsule Take 40 mg by mouth every morning.    Yes Historical Provider, MD  pyridOXINE (VITAMIN B-6) 50 MG tablet Take 25 mg by mouth every morning.    Yes Historical Provider, MD  tiotropium (SPIRIVA) 18 MCG inhalation capsule Place 18 mcg into inhaler and inhale every morning.   Yes Historical Provider, MD  ciprofloxacin (CIPRO) 500 MG tablet Take 1 tablet (500 mg total) by mouth 2 (two) times daily. One po bid x 7 days 02/05/14   Monte Fantasia, PA-C   BP 109/67  Pulse 61  Temp(Src) 98 F (36.7 C) (Oral)  Resp 18  SpO2 92% Physical Exam  Nursing note and vitals reviewed. Constitutional: He is oriented to person, place, and time. He appears well-developed and well-nourished. No distress.  HENT:  Head: Normocephalic and atraumatic.  Mouth/Throat: Uvula is midline and oropharynx is clear and moist. No oropharyngeal exudate.  Eyes: EOM are normal. Pupils are equal, round, and reactive to light. Right eye exhibits no discharge. Left eye exhibits no discharge. No scleral icterus.  Neck: Normal range of motion. Neck supple.  Cardiovascular: Normal rate, regular rhythm and normal heart sounds.   No murmur heard. Pulmonary/Chest: Effort normal and breath sounds normal. No respiratory distress.  Abdominal: Soft. There is no tenderness.  Musculoskeletal: Normal range of motion. He exhibits no edema and no tenderness.       Lumbar back: He exhibits bony tenderness and pain. He exhibits no swelling, no edema, no deformity and no laceration.       Back:  Neurological: He is alert and oriented to person, place, and time. He has normal strength. No cranial nerve deficit or sensory deficit. Coordination normal.  Unable to assess gait due to the fact the  patient is wheelchair-bound.   Skin: Skin is warm and dry. No rash noted. He is not diaphoretic.  Psychiatric: He has a normal mood and affect.    ED Course  Procedures (including critical care time) Labs Review Labs Reviewed  URINALYSIS, ROUTINE W REFLEX MICROSCOPIC - Abnormal; Notable for the following:    APPearance CLOUDY (*)    Leukocytes, UA SMALL (*)    All other components within normal limits  CBC WITH DIFFERENTIAL - Abnormal; Notable for the following:    RBC 3.28 (*)    HCT 36.2 (*)    MCV 110.4 (*)    MCH 39.6 (*)    Monocytes Relative 14 (*)    All other components within normal limits  BASIC METABOLIC PANEL - Abnormal; Notable for the following:    Creatinine, Ser 1.83 (*)    GFR calc non Af Amer 33 (*)    GFR calc Af Amer 39 (*)    All  other components within normal limits  URINE CULTURE  URINE MICROSCOPIC-ADD ON    Imaging Review Ct Abdomen Pelvis Wo Contrast  02/05/2014   CLINICAL DATA:  Back pain and hematuria  EXAM: CT ABDOMEN AND PELVIS WITHOUT CONTRAST  TECHNIQUE: Multidetector CT imaging of the abdomen and pelvis was performed following the standard protocol without IV contrast.  COMPARISON:  06/21/2013  FINDINGS: Lung bases again demonstrate chronic bronchiectatic changes bilaterally. No focal infiltrate is seen.  The liver, spleen, adrenal glands and gallbladder are within normal limits. The pancreas is unremarkable with the exception of some calcifications in the region of the head and uncinate process consistent with chronic pancreatitis. These changes are stable from the prior exam.  The kidneys are well visualized bilaterally and reveal no renal calculi or obstructive changes. The appendix is not well visualized although no inflammatory changes are seen to suggest appendicitis. The bladder is partially distended. A right hip replacement is seen with considerable scatter artifact. Degenerative changes of left hip are noted. Diffuse osteopenia is seen. Multiple  compression deformities are again identified. A new T12 compression deformity is seen but also appears chronic in nature.  IMPRESSION: New T12 compression deformity but has a chronic appearance. Clinical correlation is recommended.  Chronic changes as described above.  No acute abnormality is noted.   Electronically Signed   By: Alcide Clever M.D.   On: 02/05/2014 20:59   Dg Chest 2 View  02/05/2014   CLINICAL DATA:  Hypoxia  EXAM: CHEST  2 VIEW  COMPARISON:  02/25/2010  FINDINGS: Cardiac shadow is stable. Elevation of the right hemidiaphragm is noted with some right basilar atelectasis. No focal confluent infiltrate is seen. No bony abnormality is noted.  IMPRESSION: Mild right basilar atelectasis.   Electronically Signed   By: Alcide Clever M.D.   On: 02/05/2014 20:26     EKG Interpretation None      MDM   Final diagnoses:  Bilateral low back pain without sciatica  Acute cystitis without hematuria    13 -year-old male presenting with lower back pain since yesterday and reported hematuria since yesterday. Patient denies hematuria to me, however states staff at [facility] assisted living noticed hematuria. Patient complaining of a reproducible, positional lobar lumbar/sacral back pain. Patient states this pain feels identical to the pain he experienced in the past and has an ER and diagnosed with a compression fracture. No neuro complaints. Neuro exam benign.   6:00 PM: Patient's pain under control this time. Patient lying flat on stretcher, in no acute distress.  Mild leukocytes noted in patient's urine. Will further workup patient for possible any stone, causes of cystitis. Patient noted to have SpO2 in the mid 80s while sitting still. Patient not tachypneic, no respiratory distress, no accessory muscle usage noted. We'll workup patient with CBC, BMP, urine culture, CT abdomen pelvis, chest x-ray.  9:00 PM: CT abdomen and pelvis remarkable for impression of chronic changes with no acute  abnormality. Chest x-ray remarkable for some mild right basilar atelectasis.  Patient's pain under control this time. Patient states he "feels much better" after pain management. Patient continues to appear nontoxic, in no acute distress, non-tachypneic. Patient's O2 saturation in the mid to high 90s after patient repositioned himself. Due to patient's back pain being positional, totally relieved with rest, and patient's remaining workup being unremarkable, it is my impression that his back pain is not do to renal or abdominal pathology, but more so musculoskeletal low back pain without radiculopathy. No  neurological deficits and normal neuro exam. No loss of bowel or bladder control.  No concern for cauda equina.  No fever, night sweats, weight loss, h/o cancer, IVDU.  RICE protocol and pain medicine indicated and discussed with patient.   Patient's labs noted to be without hematuria, leukocytosis, acute abdominal pathology. Patient with a mild elevation in his creatinine. We encouraged patient to follow up with his PCP regarding his acute renal injury.  Patient states he is ready to go home. We will treat patient with Cipro for possible UTI. Patient has hydrocodone prescribed to him at home, we encouraged patient to continue using hydrocodone one pill every 4 hours as needed for back pain and encouraged him to call or return to the ER should his symptoms return, worsen or should he have a questions or concerns.   Filed Vitals:   02/05/14 2245  BP: 109/67  Pulse: 61  Temp: 98 F (36.7 C)  Resp: 18    Signed,  Ladona Mow, PA-C 2:11 AM   This patient seen and discussed with Dr. Chaney Malling, MD.     Monte Fantasia, PA-C 02/06/14 [phone]

## 2014-02-06 NOTE — ED Provider Notes (Signed)
Medical screening examination/treatment/procedure(s) were conducted as a shared visit with non-physician practitioner(s) and myself.  I personally evaluated the patient during the encounter.   EKG Interpretation None      Chase Benjamin is a 80 y.o. male hx of arthritis, lumbar compression fracture here with worsening back pain and hematuria. He noticed more back pain since yesterday, worse with movement. Nursing home noticed hematuria but he didn't realize it. Denies numbness or worsening weakness. He is wheel chair bound at baseline. On exam, some paralumbar tenderness. Abdomen soft, nontender. Able to lift legs up. Nl sensation bilateral lower extremities. I was consider stone vs new AAA vs spinal fracture. CT showed T12 fracture but that is chronic. O2 was 92-05% but CXR clear. Cr. 1.8, baseline 1.5. UA + UTI. I think pain likely from compression fracture along with early pyelo. Given ceftriaxone. He doesn't appear septic. Will d/c back to nursing home with pain meds, cipro.    Richardean Canal, MD 02/06/14 [phone]

## 2014-02-07 LAB — URINE CULTURE
Colony Count: NO GROWTH
Culture: NO GROWTH

## 2015-02-20 ENCOUNTER — Encounter: Payer: Self-pay | Admitting: Gastroenterology

## 2015-07-09 ENCOUNTER — Encounter (HOSPITAL_COMMUNITY): Payer: Self-pay | Admitting: *Deleted

## 2015-07-09 ENCOUNTER — Emergency Department (HOSPITAL_COMMUNITY)
Admission: EM | Admit: 2015-07-09 | Discharge: 2015-07-09 | Disposition: A | Discharge status: Home / Self Care | Payer: Medicare Other | Attending: Emergency Medicine | Admitting: Emergency Medicine

## 2015-07-09 ENCOUNTER — Emergency Department (HOSPITAL_COMMUNITY): Payer: Medicare Other

## 2015-07-09 DIAGNOSIS — Z79899 Other long term (current) drug therapy: Secondary | ICD-10-CM | POA: Insufficient documentation

## 2015-07-09 DIAGNOSIS — F209 Schizophrenia, unspecified: Secondary | ICD-10-CM | POA: Diagnosis not present

## 2015-07-09 DIAGNOSIS — Z87438 Personal history of other diseases of male genital organs: Secondary | ICD-10-CM | POA: Diagnosis not present

## 2015-07-09 DIAGNOSIS — Z8719 Personal history of other diseases of the digestive system: Secondary | ICD-10-CM | POA: Insufficient documentation

## 2015-07-09 DIAGNOSIS — M1990 Unspecified osteoarthritis, unspecified site: Secondary | ICD-10-CM | POA: Diagnosis not present

## 2015-07-09 DIAGNOSIS — M79604 Pain in right leg: Secondary | ICD-10-CM | POA: Insufficient documentation

## 2015-07-09 DIAGNOSIS — J449 Chronic obstructive pulmonary disease, unspecified: Secondary | ICD-10-CM | POA: Insufficient documentation

## 2015-07-09 DIAGNOSIS — Z7982 Long term (current) use of aspirin: Secondary | ICD-10-CM | POA: Insufficient documentation

## 2015-07-09 DIAGNOSIS — E785 Hyperlipidemia, unspecified: Secondary | ICD-10-CM | POA: Insufficient documentation

## 2015-07-09 DIAGNOSIS — F17210 Nicotine dependence, cigarettes, uncomplicated: Secondary | ICD-10-CM | POA: Diagnosis not present

## 2015-07-09 DIAGNOSIS — D509 Iron deficiency anemia, unspecified: Secondary | ICD-10-CM | POA: Insufficient documentation

## 2015-07-09 DIAGNOSIS — Z880 Allergy status to penicillin: Secondary | ICD-10-CM | POA: Diagnosis not present

## 2015-07-09 MED ORDER — TRAMADOL HCL 50 MG PO TABS: 25.0000 mg | ORAL_TABLET | Freq: Two times a day (BID) | ORAL | Status: AC | PRN

## 2015-07-09 MED ORDER — ACETAMINOPHEN 500 MG PO TABS: 1000.0000 mg | ORAL_TABLET | Freq: Two times a day (BID) | ORAL | Status: AC | PRN

## 2015-07-09 MED ORDER — ACETAMINOPHEN 325 MG PO TABS
650.0000 mg | ORAL_TABLET | Freq: Once | ORAL | Status: AC
  Administered 2015-07-09: 650 mg via ORAL
  Filled 2015-07-09: qty 2

## 2015-07-09 NOTE — ED Notes (Signed)
Bed: IO96 Expected date:  Expected time:  Means of arrival:  Comments: Ems- R. Leg pain

## 2015-07-09 NOTE — Progress Notes (Signed)
CSW met with patient at bedside. There was no family present. Patient confirms that he presents to [facility] due to leg pain. Also, he confirms that he comes from [facility]. Patient states he has been at the facility for 3 years.  Patient informed CSW that he receives assistance with completing ADL's. Patient states " They help me bath. I put on my own clothes". Patient states that he does not fall often. Patient denies falling within the past 6 months.   Patient states he has a good support system, which consist of his daughter. Patient states he feels safe to return to facility.   Willette Brace [phone] ED CSW 07/09/2015 7:32 PM

## 2015-07-09 NOTE — ED Provider Notes (Signed)
CSN: [id number]     Arrival date & time 07/09/15  1647 History   First MD Initiated Contact with Patient 07/09/15 1709     Chief Complaint  Patient presents with  . Leg Pain     (Consider location/radiation/quality/duration/timing/severity/associated sxs/prior Treatment) HPI Patient presents with concern of pain in the right leg. Unclear precipitant. Patient presents since yesterday. Pain is focally in the posterior distal upper right leg. Pain is sore, worse with motion, palpation. Patient has no new weakness. Patient has had substantial pain with ambulation. Patient is not taking any medication for pain relief. No recent activity changes, diet changes, medication changes. No history of DVT. No fever, chills, distal dysesthesia or weakness.  Past Medical History  Diagnosis Date  . History of psychosis LAST DOCMENTATION ADMISSION  FOR PSYCHOSIS/ DEPRESSION  WITH SCHIZOPHRENIA   02-25-2010  . COPD (chronic obstructive pulmonary disease) (HCC)   . Asthma   . OA (osteoarthritis)   . Personal history of schizophrenia   . Depression   . Phimosis SEVERE  . Penile pain SECONDARY TO SEVERE PHIMOSIS  . Wheelchair dependent CAN PIVET W/ ASSIST  . GERD (gastroesophageal reflux disease)   . Iron deficiency anemia   . Headache(784.0)   . Dyslipidemia    Past Surgical History  Procedure Laterality Date  . Total hip arthroplasty  11-15-2001  [facility]    RIGHT   . Circumcision  06/01/2012    Procedure: CIRCUMCISION ADULT;  Surgeon: Lindaann Slough, MD;  Location: [facility];  Service: Urology;  Laterality: N/A;       History reviewed. No pertinent family history. Social History  Substance Use Topics  . Smoking status: Current Every Day Smoker -- 0.25 packs/day    Types: Cigarettes  . Smokeless tobacco: None  . Alcohol Use: No    Review of Systems  Constitutional:       Per HPI, otherwise negative  HENT:       Per HPI, otherwise negative  Respiratory:        Per HPI, otherwise negative  Cardiovascular:       Per HPI, otherwise negative  Gastrointestinal: Negative for vomiting.  Endocrine:       Negative aside from HPI  Genitourinary:       Neg aside from HPI   Musculoskeletal:       Per HPI, otherwise negative  Skin: Negative.   Neurological: Negative for syncope.      Allergies  Penicillins  Home Medications   Prior to Admission medications   Medication Sig Start Date End Date Taking? Authorizing Provider  acetaminophen (TYLENOL) 500 MG tablet Take 1,000 mg by mouth 2 (two) times daily as needed for mild pain.    Yes Historical Provider, MD  aspirin EC 81 MG tablet Take 81 mg by mouth daily.   Yes Historical Provider, MD  cholecalciferol (VITAMIN D) 1000 UNITS tablet Take 1,000 Units by mouth every morning.    Yes Historical Provider, MD  citalopram (CELEXA) 20 MG tablet Take 20 mg by mouth every morning.    Yes Historical Provider, MD  cyclobenzaprine (FLEXERIL) 5 MG tablet Take 5 mg by mouth at bedtime.   Yes Historical Provider, MD  fenofibrate micronized (LOFIBRA) 200 MG capsule Take 200 mg by mouth daily before breakfast.   Yes Historical Provider, MD  ferrous sulfate 325 (65 FE) MG tablet Take 325 mg by mouth daily with breakfast.   Yes Historical Provider, MD  folic acid (FOLVITE) 800 MCG tablet  Take 800 mcg by mouth every morning.    Yes Historical Provider, MD  HYDROcodone-acetaminophen (NORCO/VICODIN) 5-325 MG per tablet Take 1 tablet by mouth every 4 (four) hours as needed for pain.   Yes Historical Provider, MD  Menthol, Topical Analgesic, (ICY HOT EX) Apply 1 patch topically 4 (four) times daily as needed (for pain).   Yes Historical Provider, MD  nitroGLYCERIN (NITROSTAT) 0.4 MG SL tablet Place 0.4 mg under the tongue every 5 (five) minutes as needed for chest pain.    Yes Historical Provider, MD  OLANZapine (ZYPREXA) 2.5 MG tablet Take 2.5 mg by mouth every morning.   Yes Historical Provider, MD  Phenylephrine-DM-GG  (QC TUSSIN CF) 5-10-100 MG/5ML LIQD Take 10 mLs by mouth every 6 (six) hours as needed (cough).   Yes Historical Provider, MD  pyridOXINE (VITAMIN B-6) 50 MG tablet Take 25 mg by mouth every morning.    Yes Historical Provider, MD  tiotropium (SPIRIVA) 18 MCG inhalation capsule Place 18 mcg into inhaler and inhale every morning.   Yes Historical Provider, MD  traMADol (ULTRAM) 50 MG tablet Take 25 mg by mouth every 12 (twelve) hours as needed for severe pain.   Yes Historical Provider, MD   BP 121/70 mmHg  Pulse 86  Temp(Src) 98.2 F (36.8 C) (Oral)  Resp 16  SpO2 91% Physical Exam  Constitutional: He is oriented to person, place, and time. He appears well-developed. No distress.  HENT:  Head: Normocephalic and atraumatic.  Eyes: Conjunctivae and EOM are normal.  Cardiovascular: Normal rate and regular rhythm.   Pulmonary/Chest: Effort normal. No stridor. No respiratory distress.  Abdominal: He exhibits no distension.  Musculoskeletal: He exhibits no edema.  Patient hesitant to move the right lower extremity secondary to pain. There is some tenderness to palpation in the right medial distal posterior thigh, but no appreciable tenderness No knee effusion. Patient is hesitant to move the knee beyond 160/180. Patient can flex the hip spontaneously, has 4/5 strength in both lower extremity, throughout.   Neurological: He is alert and oriented to person, place, and time.  Skin: Skin is warm and dry.  Psychiatric: He has a normal mood and affect.  Nursing note and vitals reviewed.   ED Course  Procedures (including critical care time) Labs Review Labs Reviewed - No data to display  Imaging Review Dg Knee Complete 4 Views Right  07/09/2015  CLINICAL DATA:  Right leg pain EXAM: RIGHT KNEE - COMPLETE 4+ VIEW COMPARISON:  None. FINDINGS: There is no evidence of fracture, dislocation. There is minimal suprapatellar effusion. There is osteopenia. There is mild decreased femoral tibial joint  space. Soft tissues are unremarkable. IMPRESSION: No acute fracture or dislocation. Diffuse osteopenia. Mild degenerative joint changes of the right knee. Electronically Signed   By: Sherian Rein M.D.   On: 07/09/2015 17:49   Dg Hip Unilat With Pelvis 2-3 Views Right  07/09/2015  CLINICAL DATA:  82 year old with acute onset of right lower extremity pain extending from the hip to the toes which began last night. No recent injuries. Prior right total hip arthroplasty. EXAM: DG HIP (WITH OR WITHOUT PELVIS) 2-3V RIGHT COMPARISON:  Bone window images from CT abdomen and pelvis 04/09/2014. FINDINGS: Prior right total hip arthroplasty with anatomic alignment. Severe osseous demineralization. No evidence of acute fracture. Included AP pelvis demonstrates moderate joint space narrowing in the contralateral left hip. Sacroiliac joints and symphysis pubis intact. IMPRESSION: 1. No acute osseous abnormality. 2. Right total hip arthroplasty with anatomic alignment  and no complicating features. 3. Severe osseous demineralization. Electronically Signed   By: Hulan Saas M.D.   On: 07/09/2015 17:51   I have personally reviewed and evaluated these images and lab results as part of my medical decision-making.  On repeat exam the patient is in no distress.  We discussed all findings.  Patient will follow-up with orthopedics.  MDM  Elderly male presents with atraumatic right leg pain. Here, patient is awake, alert, in no distress. Patient is afebrile, hemodynamically stable, with no loss of distal neurovascular function. X-rays are unremarkable, and the patient acknowledges a history of arthritis. Symptoms likely musculoskeletal skeletal, with no evidence for DVT, neurovascular, otherwise per Patient discharged in stable condition with initiation of analgesia, to follow-up with orthopedics.  Gerhard Munch, MD 07/09/15 [phone]

## 2015-07-09 NOTE — ED Notes (Signed)
2 Attempts made to call facility.

## 2015-07-09 NOTE — ED Notes (Addendum)
Patient was sent out today from [facility] with a complaint of right leg pain. Staff states that there has been no injury or trauma. Patient has orders for pain medication his MAR that were not administered by nursing staff. No deformity noted. No nurses currently at the facility. Patient was sent out by a medication technician and not given pain medication according to MD order.

## 2015-07-09 NOTE — Discharge Instructions (Signed)
As discussed, your evaluation today has been largely reassuring.  But, it is important that you monitor your condition carefully, and do not hesitate to return to the ED if you develop new, or concerning changes in your condition. ° °Otherwise, please follow-up with your physician for appropriate ongoing care. ° °Cryotherapy °Cryotherapy means treatment with cold. Ice or gel packs can be used to reduce both pain and swelling. Ice is the most helpful within the first 24 to 48 hours after an injury or flare-up from overusing a muscle or joint. Sprains, strains, spasms, burning pain, shooting pain, and aches can all be eased with ice. Ice can also be used when recovering from surgery. Ice is effective, has very few side effects, and is safe for most people to use. °PRECAUTIONS  °Ice is not a safe treatment option for people with: °· Raynaud phenomenon. This is a condition affecting small blood vessels in the extremities. Exposure to cold may cause your problems to return. °· Cold hypersensitivity. There are many forms of cold hypersensitivity, including: °¨ Cold urticaria. Red, itchy hives appear on the skin when the tissues begin to warm after being iced. °¨ Cold erythema. This is a red, itchy rash caused by exposure to cold. °¨ Cold hemoglobinuria. Red blood cells break down when the tissues begin to warm after being iced. The hemoglobin that carry oxygen are passed into the urine because they cannot combine with blood proteins fast enough. °· Numbness or altered sensitivity in the area being iced. °If you have any of the following conditions, do not use ice until you have discussed cryotherapy with your caregiver: °· Heart conditions, such as arrhythmia, angina, or chronic heart disease. °· High blood pressure. °· Healing wounds or open skin in the area being iced. °· Current infections. °· Rheumatoid arthritis. °· Poor circulation. °· Diabetes. °Ice slows the blood flow in the region it is applied. This is  beneficial when trying to stop inflamed tissues from spreading irritating chemicals to surrounding tissues. However, if you expose your skin to cold temperatures for too long or without the proper protection, you can damage your skin or nerves. Watch for signs of skin damage due to cold. °HOME CARE INSTRUCTIONS °Follow these tips to use ice and cold packs safely. °· Place a dry or damp towel between the ice and skin. A damp towel will cool the skin more quickly, so you may need to shorten the time that the ice is used. °· For a more rapid response, add gentle compression to the ice. °· Ice for no more than 10 to 20 minutes at a time. The bonier the area you are icing, the less time it will take to get the benefits of ice. °· Check your skin after 5 minutes to make sure there are no signs of a poor response to cold or skin damage. °· Rest 20 minutes or more between uses. °· Once your skin is numb, you can end your treatment. You can test numbness by very lightly touching your skin. The touch should be so light that you do not see the skin dimple from the pressure of your fingertip. When using ice, most people will feel these normal sensations in this order: cold, burning, aching, and numbness. °· Do not use ice on someone who cannot communicate their responses to pain, such as small children or people with dementia. °HOW TO MAKE AN ICE PACK °Ice packs are the most common way to use ice therapy. Other methods include ice   massage, ice baths, and cryosprays. Muscle creams that cause a cold, tingly feeling do not offer the same benefits that ice offers and should not be used as a substitute unless recommended by your caregiver. °To make an ice pack, do one of the following: °· Place crushed ice or a bag of frozen vegetables in a sealable plastic bag. Squeeze out the excess air. Place this bag inside another plastic bag. Slide the bag into a pillowcase or place a damp towel between your skin and the bag. °· Mix 3 parts  water with 1 part rubbing alcohol. Freeze the mixture in a sealable plastic bag. When you remove the mixture from the freezer, it will be slushy. Squeeze out the excess air. Place this bag inside another plastic bag. Slide the bag into a pillowcase or place a damp towel between your skin and the bag. °SEEK MEDICAL CARE IF: °· You develop white spots on your skin. This may give the skin a blotchy (mottled) appearance. °· Your skin turns blue or pale. °· Your skin becomes waxy or hard. °· Your swelling gets worse. °MAKE SURE YOU:  °· Understand these instructions. °· Will watch your condition. °· Will get help right away if you are not doing well or get worse. °  °This information is not intended to replace advice given to you by your health care provider. Make sure you discuss any questions you have with your health care provider. °  °Document Released: 01/17/2011 Document Revised: 06/13/2014 Document Reviewed: 01/17/2011 °Elsevier Interactive Patient Education ©2016 [employer] ° °

## 2015-07-15 ENCOUNTER — Emergency Department (HOSPITAL_COMMUNITY)
Admission: EM | Admit: 2015-07-15 | Discharge: 2015-07-15 | Disposition: A | Discharge status: Home / Self Care | Payer: Medicare Other | Attending: Emergency Medicine | Admitting: Emergency Medicine

## 2015-07-15 ENCOUNTER — Encounter (HOSPITAL_COMMUNITY): Payer: Self-pay | Admitting: Emergency Medicine

## 2015-07-15 ENCOUNTER — Emergency Department (HOSPITAL_COMMUNITY): Payer: Medicare Other

## 2015-07-15 DIAGNOSIS — M1990 Unspecified osteoarthritis, unspecified site: Secondary | ICD-10-CM | POA: Diagnosis not present

## 2015-07-15 DIAGNOSIS — Z8719 Personal history of other diseases of the digestive system: Secondary | ICD-10-CM | POA: Insufficient documentation

## 2015-07-15 DIAGNOSIS — Z880 Allergy status to penicillin: Secondary | ICD-10-CM | POA: Diagnosis not present

## 2015-07-15 DIAGNOSIS — W06XXXA Fall from bed, initial encounter: Secondary | ICD-10-CM | POA: Diagnosis not present

## 2015-07-15 DIAGNOSIS — Z87438 Personal history of other diseases of male genital organs: Secondary | ICD-10-CM | POA: Diagnosis not present

## 2015-07-15 DIAGNOSIS — Z8639 Personal history of other endocrine, nutritional and metabolic disease: Secondary | ICD-10-CM | POA: Diagnosis not present

## 2015-07-15 DIAGNOSIS — S7011XA Contusion of right thigh, initial encounter: Secondary | ICD-10-CM | POA: Diagnosis not present

## 2015-07-15 DIAGNOSIS — Y9289 Other specified places as the place of occurrence of the external cause: Secondary | ICD-10-CM | POA: Insufficient documentation

## 2015-07-15 DIAGNOSIS — S79911A Unspecified injury of right hip, initial encounter: Secondary | ICD-10-CM | POA: Diagnosis present

## 2015-07-15 DIAGNOSIS — F329 Major depressive disorder, single episode, unspecified: Secondary | ICD-10-CM | POA: Diagnosis not present

## 2015-07-15 DIAGNOSIS — Y998 Other external cause status: Secondary | ICD-10-CM | POA: Diagnosis not present

## 2015-07-15 DIAGNOSIS — S7001XA Contusion of right hip, initial encounter: Secondary | ICD-10-CM | POA: Insufficient documentation

## 2015-07-15 DIAGNOSIS — Y9389 Activity, other specified: Secondary | ICD-10-CM | POA: Insufficient documentation

## 2015-07-15 DIAGNOSIS — F17210 Nicotine dependence, cigarettes, uncomplicated: Secondary | ICD-10-CM | POA: Diagnosis not present

## 2015-07-15 DIAGNOSIS — Z79899 Other long term (current) drug therapy: Secondary | ICD-10-CM | POA: Diagnosis not present

## 2015-07-15 DIAGNOSIS — D509 Iron deficiency anemia, unspecified: Secondary | ICD-10-CM | POA: Diagnosis not present

## 2015-07-15 DIAGNOSIS — J449 Chronic obstructive pulmonary disease, unspecified: Secondary | ICD-10-CM | POA: Insufficient documentation

## 2015-07-15 DIAGNOSIS — Z7982 Long term (current) use of aspirin: Secondary | ICD-10-CM | POA: Insufficient documentation

## 2015-07-15 LAB — CBC WITH DIFFERENTIAL/PLATELET
Basophils Absolute: 0 10*3/uL (ref 0.0–0.1)
Basophils Relative: 1 %
Eosinophils Absolute: 0.1 10*3/uL (ref 0.0–0.7)
Eosinophils Relative: 2 %
HCT: 35.2 % — ABNORMAL LOW (ref 39.0–52.0)
HEMOGLOBIN: 12 g/dL — AB (ref 13.0–17.0)
LYMPHS ABS: 1.6 10*3/uL (ref 0.7–4.0)
Lymphocytes Relative: 29 %
MCH: 37.9 pg — ABNORMAL HIGH (ref 26.0–34.0)
MCHC: 34.1 g/dL (ref 30.0–36.0)
MCV: 111 fL — ABNORMAL HIGH (ref 78.0–100.0)
Monocytes Absolute: 0.8 10*3/uL (ref 0.1–1.0)
Monocytes Relative: 15 %
NEUTROS PCT: 54 %
Neutro Abs: 3 10*3/uL (ref 1.7–7.7)
PLATELETS: 194 10*3/uL (ref 150–400)
RBC: 3.17 MIL/uL — AB (ref 4.22–5.81)
RDW: 13.2 % (ref 11.5–15.5)
WBC: 5.5 10*3/uL (ref 4.0–10.5)

## 2015-07-15 LAB — I-STAT CHEM 8, ED
BUN: 19 mg/dL (ref 6–20)
CHLORIDE: 107 mmol/L (ref 101–111)
Calcium, Ion: 1.15 mmol/L (ref 1.13–1.30)
Creatinine, Ser: 1.4 mg/dL — ABNORMAL HIGH (ref 0.61–1.24)
Glucose, Bld: 96 mg/dL (ref 65–99)
HEMATOCRIT: 36 % — AB (ref 39.0–52.0)
Hemoglobin: 12.2 g/dL — ABNORMAL LOW (ref 13.0–17.0)
Potassium: 5.3 mmol/L — ABNORMAL HIGH (ref 3.5–5.1)
Sodium: 141 mmol/L (ref 135–145)
TCO2: 26 mmol/L (ref 0–100)

## 2015-07-15 MED ORDER — HYDROMORPHONE HCL 1 MG/ML IJ SOLN
1.0000 mg | Freq: Once | INTRAMUSCULAR | Status: AC
  Administered 2015-07-15: 1 mg via INTRAMUSCULAR
  Filled 2015-07-15: qty 1

## 2015-07-15 MED ORDER — HYDROCODONE-ACETAMINOPHEN 5-325 MG PO TABS: 1.0000 | ORAL_TABLET | Freq: Four times a day (QID) | ORAL | Status: AC | PRN

## 2015-07-15 NOTE — ED Provider Notes (Signed)
CSN: [id number]     Arrival date & time 07/15/15  2045 History   First MD Initiated Contact with Patient 07/15/15 2053     Chief Complaint  Patient presents with  . Hip Pain  . Fall     (Consider location/radiation/quality/duration/timing/severity/associated sxs/prior Treatment) Patient is a 82 y.o. male presenting with hip pain and fall. The history is provided by the patient (Patient states that he was getting out of the bed trying to get into his wheelchair and he fell onto his right hip. He did not hit his head he complains of pain in that hip).  Hip Pain This is a new problem. The current episode started 6 to 12 hours ago. The problem occurs constantly. The problem has not changed since onset.Pertinent negatives include no chest pain, no abdominal pain and no headaches. Exacerbated by: Movement. Nothing relieves the symptoms.  Fall Pertinent negatives include no chest pain, no abdominal pain and no headaches.    Past Medical History  Diagnosis Date  . History of psychosis LAST DOCMENTATION ADMISSION  FOR PSYCHOSIS/ DEPRESSION  WITH SCHIZOPHRENIA   02-25-2010  . COPD (chronic obstructive pulmonary disease) (HCC)   . Asthma   . OA (osteoarthritis)   . Personal history of schizophrenia   . Depression   . Phimosis SEVERE  . Penile pain SECONDARY TO SEVERE PHIMOSIS  . Wheelchair dependent CAN PIVET W/ ASSIST  . GERD (gastroesophageal reflux disease)   . Iron deficiency anemia   . Headache(784.0)   . Dyslipidemia    Past Surgical History  Procedure Laterality Date  . Total hip arthroplasty  11-15-2001  DR [facility]    RIGHT   . Circumcision  06/01/2012    Procedure: CIRCUMCISION ADULT;  Surgeon: Lindaann Slough, MD;  Location: [facility];  Service: Urology;  Laterality: N/A;       History reviewed. No pertinent family history. Social History  Substance Use Topics  . Smoking status: Current Every Day Smoker -- 0.25 packs/day    Types: Cigarettes  .  Smokeless tobacco: None  . Alcohol Use: No    Review of Systems  Constitutional: Negative for appetite change and fatigue.  HENT: Negative for congestion, ear discharge and sinus pressure.   Eyes: Negative for discharge.  Respiratory: Negative for cough.   Cardiovascular: Negative for chest pain.  Gastrointestinal: Negative for abdominal pain and diarrhea.  Genitourinary: Negative for frequency and hematuria.  Musculoskeletal: Negative for back pain.       Right hip pain  Skin: Negative for rash.  Neurological: Negative for seizures and headaches.  Psychiatric/Behavioral: Negative for hallucinations.      Allergies  Penicillins  Home Medications   Prior to Admission medications   Medication Sig Start Date End Date Taking? Authorizing Provider  acetaminophen (TYLENOL) 500 MG tablet Take 2 tablets (1,000 mg total) by mouth 2 (two) times daily as needed for mild pain. 07/09/15  Yes Gerhard Munch, MD  aspirin EC 81 MG tablet Take 81 mg by mouth daily.   Yes Historical Provider, MD  cholecalciferol (VITAMIN D) 1000 UNITS tablet Take 1,000 Units by mouth every morning.    Yes Historical Provider, MD  citalopram (CELEXA) 20 MG tablet Take 20 mg by mouth every morning.    Yes Historical Provider, MD  cyclobenzaprine (FLEXERIL) 5 MG tablet Take 5 mg by mouth at bedtime.   Yes Historical Provider, MD  fenofibrate micronized (LOFIBRA) 200 MG capsule Take 200 mg by mouth daily before breakfast.   Yes  Historical Provider, MD  ferrous sulfate 325 (65 FE) MG tablet Take 325 mg by mouth daily with breakfast.   Yes Historical Provider, MD  folic acid (FOLVITE) 800 MCG tablet Take 800 mcg by mouth every morning.    Yes Historical Provider, MD  nitroGLYCERIN (NITROSTAT) 0.4 MG SL tablet Place 0.4 mg under the tongue every 5 (five) minutes as needed for chest pain.    Yes Historical Provider, MD  OLANZapine (ZYPREXA) 2.5 MG tablet Take 2.5 mg by mouth every morning.   Yes Historical Provider, MD   Phenylephrine-DM-GG (QC TUSSIN CF) 5-10-100 MG/5ML LIQD Take 10 mLs by mouth every 6 (six) hours as needed (cough).   Yes Historical Provider, MD  pyridOXINE (VITAMIN B-6) 50 MG tablet Take 25 mg by mouth every morning.    Yes Historical Provider, MD  tiotropium (SPIRIVA) 18 MCG inhalation capsule Place 18 mcg into inhaler and inhale every morning.   Yes Historical Provider, MD  traMADol (ULTRAM) 50 MG tablet Take 0.5 tablets (25 mg total) by mouth every 12 (twelve) hours as needed for severe pain. 07/09/15  Yes Gerhard Munch, MD  HYDROcodone-acetaminophen (NORCO/VICODIN) 5-325 MG tablet Take 1 tablet by mouth every 6 (six) hours as needed for moderate pain. 07/15/15   Bethann Berkshire, MD  Menthol, Topical Analgesic, (ICY HOT EX) Apply 1 patch topically 4 (four) times daily as needed (for pain).    Historical Provider, MD   BP 115/79 mmHg  Pulse 78  Temp(Src) 98 F (36.7 C) (Oral)  Resp 18  Ht 5\' 9"  (1.753 m)  Wt 150 lb (68.04 kg)  BMI 22.14 kg/m2  SpO2 99% Physical Exam  Constitutional: He is oriented to person, place, and time. He appears well-developed.  HENT:  Head: Normocephalic.  Eyes: Conjunctivae and EOM are normal. No scleral icterus.  Neck: Neck supple. No thyromegaly present.  Cardiovascular: Normal rate and regular rhythm.  Exam reveals no gallop and no friction rub.   No murmur heard. Pulmonary/Chest: No stridor. He has no wheezes. He has no rales. He exhibits no tenderness.  Abdominal: He exhibits no distension. There is no tenderness. There is no rebound.  Musculoskeletal: Normal range of motion. He exhibits tenderness. He exhibits no edema.  Tenderness to right hip and femur  Lymphadenopathy:    He has no cervical adenopathy.  Neurological: He is oriented to person, place, and time. He exhibits normal muscle tone. Coordination normal.  Skin: No rash noted. No erythema.  Psychiatric: He has a normal mood and affect. His behavior is normal.    ED Course  Procedures  (including critical care time) Labs Review Labs Reviewed  CBC WITH DIFFERENTIAL/PLATELET - Abnormal; Notable for the following:    RBC 3.17 (*)    Hemoglobin 12.0 (*)    HCT 35.2 (*)    MCV 111.0 (*)    MCH 37.9 (*)    All other components within normal limits  I-STAT CHEM 8, ED - Abnormal; Notable for the following:    Potassium 5.3 (*)    Creatinine, Ser 1.40 (*)    Hemoglobin 12.2 (*)    HCT 36.0 (*)    All other components within normal limits    Imaging Review Dg Hip Unilat  With Pelvis 2-3 Views Right  07/15/2015  CLINICAL DATA:  Status post fall while trying to get into wheelchair, with right hip pain. Initial encounter. EXAM: DG HIP (WITH OR WITHOUT PELVIS) 2-3V RIGHT COMPARISON:  Right hip radiographs performed 07/09/2015 FINDINGS: Diffuse lucency about  the acetabular component of the right hip prosthesis could reflect particle disease. There is no evidence of loosening or fracture. The right hip prosthesis is otherwise grossly intact. The left hip joint is grossly unremarkable in appearance. The sacroiliac joints are grossly unremarkable in appearance. The visualized bowel gas pattern is unremarkable in appearance. IMPRESSION: No evidence of loosening or fracture. Right hip prosthesis is grossly intact, aside from persistent diffuse lucency about the acetabular component of the right hip prosthesis, which could reflect particle disease depending on the patient's symptoms. Electronically Signed   By: Roanna Raider M.D.   On: 07/15/2015 21:27   I have personally reviewed and evaluated these images and lab results as part of my medical decision-making.   EKG Interpretation None      MDM   Final diagnoses:  Contusion, hip and thigh, right, initial encounter    X-ray shows patient has had hip replacement on the right side.   Hip replacement appears unaffected from a fall. Patient was given pain medicine here in the emergency department and a prescription of Percocet he will  follow-up with his PCP    Bethann Berkshire, MD 07/15/15 2247

## 2015-07-15 NOTE — Discharge Instructions (Signed)
Follow up with your md for recheck next week 

## 2015-07-15 NOTE — ED Notes (Signed)
Delayed in blood work, pt transported to X-ray 

## 2015-07-15 NOTE — ED Notes (Signed)
Pt presents via [facility] from [facility] c/o right hip pain after falling during a transfer from bed to wheelchair.  Pain 0/10 at rest, increased with movement and painful on palpation.  He is alert and oriented x 4. No visible shortening or rotation to right leg.  Denies hitting head, no LOC. No obvious trauma.

## 2015-07-15 NOTE — ED Notes (Signed)
Bed: AV40 Expected date:  Expected time:  Means of arrival:  Comments: EMS RT hip pain

## 2015-07-15 NOTE — Progress Notes (Signed)
CSW attempted to speak with patient at bedside. However, patient is not present. CSW consulted with nurse who states patient is in X-ray. CSW will attempt to meet with patient again.  Trish Mage [phone] ED CSW 07/15/2015 9:47 PM

## 2016-02-04 ENCOUNTER — Emergency Department (HOSPITAL_COMMUNITY): Payer: Medicare Other

## 2016-02-04 ENCOUNTER — Encounter (HOSPITAL_COMMUNITY): Payer: Self-pay

## 2016-02-04 DIAGNOSIS — J810 Acute pulmonary edema: Secondary | ICD-10-CM | POA: Insufficient documentation

## 2016-02-04 DIAGNOSIS — E785 Hyperlipidemia, unspecified: Secondary | ICD-10-CM | POA: Diagnosis present

## 2016-02-04 DIAGNOSIS — J189 Pneumonia, unspecified organism: Secondary | ICD-10-CM | POA: Diagnosis not present

## 2016-02-04 DIAGNOSIS — I4581 Long QT syndrome: Secondary | ICD-10-CM | POA: Diagnosis present

## 2016-02-04 DIAGNOSIS — I469 Cardiac arrest, cause unspecified: Secondary | ICD-10-CM | POA: Diagnosis not present

## 2016-02-04 DIAGNOSIS — I214 Non-ST elevation (NSTEMI) myocardial infarction: Secondary | ICD-10-CM | POA: Diagnosis present

## 2016-02-04 DIAGNOSIS — R0603 Acute respiratory distress: Secondary | ICD-10-CM

## 2016-02-04 DIAGNOSIS — F329 Major depressive disorder, single episode, unspecified: Secondary | ICD-10-CM | POA: Diagnosis present

## 2016-02-04 DIAGNOSIS — I959 Hypotension, unspecified: Secondary | ICD-10-CM | POA: Diagnosis present

## 2016-02-04 DIAGNOSIS — J9621 Acute and chronic respiratory failure with hypoxia: Secondary | ICD-10-CM | POA: Diagnosis present

## 2016-02-04 DIAGNOSIS — R739 Hyperglycemia, unspecified: Secondary | ICD-10-CM | POA: Diagnosis present

## 2016-02-04 DIAGNOSIS — N400 Enlarged prostate without lower urinary tract symptoms: Secondary | ICD-10-CM | POA: Diagnosis present

## 2016-02-04 DIAGNOSIS — I509 Heart failure, unspecified: Secondary | ICD-10-CM | POA: Diagnosis present

## 2016-02-04 DIAGNOSIS — I255 Ischemic cardiomyopathy: Secondary | ICD-10-CM | POA: Diagnosis not present

## 2016-02-04 DIAGNOSIS — G9340 Encephalopathy, unspecified: Secondary | ICD-10-CM | POA: Diagnosis present

## 2016-02-04 DIAGNOSIS — E872 Acidosis, unspecified: Secondary | ICD-10-CM

## 2016-02-04 DIAGNOSIS — Z7982 Long term (current) use of aspirin: Secondary | ICD-10-CM | POA: Diagnosis not present

## 2016-02-04 DIAGNOSIS — J441 Chronic obstructive pulmonary disease with (acute) exacerbation: Secondary | ICD-10-CM | POA: Diagnosis present

## 2016-02-04 DIAGNOSIS — J440 Chronic obstructive pulmonary disease with acute lower respiratory infection: Secondary | ICD-10-CM | POA: Diagnosis present

## 2016-02-04 DIAGNOSIS — N179 Acute kidney failure, unspecified: Secondary | ICD-10-CM | POA: Diagnosis present

## 2016-02-04 DIAGNOSIS — J9601 Acute respiratory failure with hypoxia: Secondary | ICD-10-CM | POA: Diagnosis not present

## 2016-02-04 DIAGNOSIS — M1990 Unspecified osteoarthritis, unspecified site: Secondary | ICD-10-CM | POA: Diagnosis present

## 2016-02-04 DIAGNOSIS — A419 Sepsis, unspecified organism: Secondary | ICD-10-CM | POA: Diagnosis not present

## 2016-02-04 DIAGNOSIS — F17210 Nicotine dependence, cigarettes, uncomplicated: Secondary | ICD-10-CM | POA: Diagnosis present

## 2016-02-04 DIAGNOSIS — J9600 Acute respiratory failure, unspecified whether with hypoxia or hypercapnia: Secondary | ICD-10-CM | POA: Diagnosis not present

## 2016-02-04 DIAGNOSIS — N289 Disorder of kidney and ureter, unspecified: Secondary | ICD-10-CM | POA: Diagnosis not present

## 2016-02-04 DIAGNOSIS — J811 Chronic pulmonary edema: Secondary | ICD-10-CM

## 2016-02-04 DIAGNOSIS — Z993 Dependence on wheelchair: Secondary | ICD-10-CM | POA: Diagnosis not present

## 2016-02-04 DIAGNOSIS — N183 Chronic kidney disease, stage 3 (moderate): Secondary | ICD-10-CM | POA: Diagnosis present

## 2016-02-04 DIAGNOSIS — K219 Gastro-esophageal reflux disease without esophagitis: Secondary | ICD-10-CM | POA: Diagnosis present

## 2016-02-04 DIAGNOSIS — Z96641 Presence of right artificial hip joint: Secondary | ICD-10-CM | POA: Diagnosis present

## 2016-02-04 DIAGNOSIS — J9620 Acute and chronic respiratory failure, unspecified whether with hypoxia or hypercapnia: Secondary | ICD-10-CM

## 2016-02-04 DIAGNOSIS — R0602 Shortness of breath: Secondary | ICD-10-CM | POA: Diagnosis present

## 2016-02-04 DIAGNOSIS — Z66 Do not resuscitate: Secondary | ICD-10-CM | POA: Diagnosis not present

## 2016-02-04 DIAGNOSIS — F209 Schizophrenia, unspecified: Secondary | ICD-10-CM | POA: Diagnosis present

## 2016-02-04 DIAGNOSIS — Z430 Encounter for attention to tracheostomy: Secondary | ICD-10-CM

## 2016-02-04 DIAGNOSIS — I213 ST elevation (STEMI) myocardial infarction of unspecified site: Secondary | ICD-10-CM | POA: Diagnosis not present

## 2016-02-04 HISTORY — DX: Muscle weakness (generalized): M62.81

## 2016-02-04 HISTORY — DX: Unspecified urinary incontinence: R32

## 2016-02-04 HISTORY — DX: Benign prostatic hyperplasia without lower urinary tract symptoms: N40.0

## 2016-02-04 LAB — BLOOD GAS, ARTERIAL
Acid-base deficit: 10.7 mmol/L — ABNORMAL HIGH (ref 0.0–2.0)
Bicarbonate: 16.2 mmol/L — ABNORMAL LOW (ref 20.0–28.0)
Drawn by: 441261
O2 CONTENT: 4 L/min
O2 Saturation: 89.8 %
PCO2 ART: 40.9 mmHg (ref 32.0–48.0)
PH ART: 7.221 — AB (ref 7.350–7.450)
PO2 ART: 72.8 mmHg — AB (ref 83.0–108.0)
Patient temperature: 98.6

## 2016-02-04 LAB — CBG MONITORING, ED: Glucose-Capillary: 244 mg/dL — ABNORMAL HIGH (ref 65–99)

## 2016-02-04 LAB — COMPREHENSIVE METABOLIC PANEL
ALBUMIN: 3.5 g/dL (ref 3.5–5.0)
ALT: 23 U/L (ref 17–63)
AST: 45 U/L — AB (ref 15–41)
Alkaline Phosphatase: 47 U/L (ref 38–126)
Anion gap: 9 (ref 5–15)
BUN: 29 mg/dL — AB (ref 6–20)
CHLORIDE: 111 mmol/L (ref 101–111)
CO2: 21 mmol/L — AB (ref 22–32)
CREATININE: 1.6 mg/dL — AB (ref 0.61–1.24)
Calcium: 8.7 mg/dL — ABNORMAL LOW (ref 8.9–10.3)
GFR calc Af Amer: 45 mL/min — ABNORMAL LOW (ref >60)
GFR calc non Af Amer: 39 mL/min — ABNORMAL LOW (ref >60)
Glucose, Bld: 166 mg/dL — ABNORMAL HIGH (ref 65–99)
POTASSIUM: 3.9 mmol/L (ref 3.5–5.1)
SODIUM: 141 mmol/L (ref 135–145)
Total Bilirubin: 1 mg/dL (ref 0.3–1.2)
Total Protein: 7.1 g/dL (ref 6.5–8.1)

## 2016-02-04 LAB — CBC
HCT: 34.3 % — ABNORMAL LOW (ref 39.0–52.0)
HEMOGLOBIN: 11.7 g/dL — AB (ref 13.0–17.0)
MCH: 38.1 pg — ABNORMAL HIGH (ref 26.0–34.0)
MCHC: 34.1 g/dL (ref 30.0–36.0)
MCV: 111.7 fL — AB (ref 78.0–100.0)
Platelets: 201 10*3/uL (ref 150–400)
RBC: 3.07 MIL/uL — AB (ref 4.22–5.81)
RDW: 13.6 % (ref 11.5–15.5)
WBC: 10.1 10*3/uL (ref 4.0–10.5)

## 2016-02-04 LAB — BASIC METABOLIC PANEL
ANION GAP: 9 (ref 5–15)
BUN: 30 mg/dL — ABNORMAL HIGH (ref 6–20)
CALCIUM: 8.8 mg/dL — AB (ref 8.9–10.3)
CHLORIDE: 112 mmol/L — AB (ref 101–111)
CO2: 21 mmol/L — AB (ref 22–32)
CREATININE: 1.7 mg/dL — AB (ref 0.61–1.24)
GFR calc non Af Amer: 36 mL/min — ABNORMAL LOW (ref >60)
GFR, EST AFRICAN AMERICAN: 42 mL/min — AB (ref >60)
Glucose, Bld: 168 mg/dL — ABNORMAL HIGH (ref 65–99)
Potassium: 3.9 mmol/L (ref 3.5–5.1)
Sodium: 142 mmol/L (ref 135–145)

## 2016-02-04 LAB — I-STAT CG4 LACTIC ACID, ED: LACTIC ACID, VENOUS: 5.53 mmol/L — AB (ref 0.5–1.9)

## 2016-02-04 LAB — PROTIME-INR
INR: 1.11
Prothrombin Time: 14.3 seconds (ref 11.4–15.2)

## 2016-02-04 LAB — TROPONIN I
TROPONIN I: 5.79 ng/mL — AB (ref <0.03)
Troponin I: 5.18 ng/mL (ref <0.03)

## 2016-02-04 LAB — APTT: APTT: 32 s (ref 24–36)

## 2016-02-04 LAB — BRAIN NATRIURETIC PEPTIDE: B Natriuretic Peptide: 872.2 pg/mL — ABNORMAL HIGH (ref 0.0–100.0)

## 2016-02-04 LAB — GLUCOSE, CAPILLARY: Glucose-Capillary: 210 mg/dL — ABNORMAL HIGH (ref 65–99)

## 2016-02-04 MED ORDER — SODIUM CHLORIDE 0.9 % IV SOLN
10.0000 mL/h | INTRAVENOUS | Status: DC
  Administered 2016-02-04: 10 mL/h via INTRAVENOUS

## 2016-02-04 MED ORDER — ASPIRIN 81 MG PO CHEW
324.0000 mg | CHEWABLE_TABLET | Freq: Once | ORAL | Status: AC
  Administered 2016-02-04: 324 mg via ORAL
  Filled 2016-02-04: qty 4

## 2016-02-04 MED ORDER — SODIUM CHLORIDE 0.9 % IV SOLN: INTRAVENOUS | Status: DC

## 2016-02-04 MED ORDER — MORPHINE SULFATE (PF) 2 MG/ML IV SOLN: 1.0000 mg | INTRAVENOUS | Status: DC | PRN

## 2016-02-04 MED ORDER — ORAL CARE MOUTH RINSE
15.0000 mL | Freq: Two times a day (BID) | OROMUCOSAL | Status: DC
  Administered 2016-02-05 – 2016-02-07 (×2): 15 mL via OROMUCOSAL

## 2016-02-04 MED ORDER — CHLORHEXIDINE GLUCONATE 0.12 % MT SOLN
15.0000 mL | Freq: Two times a day (BID) | OROMUCOSAL | Status: DC
  Administered 2016-02-05 – 2016-02-07 (×5): 15 mL via OROMUCOSAL
  Filled 2016-02-04 (×3): qty 15

## 2016-02-04 MED ORDER — SODIUM CHLORIDE 0.9 % IV SOLN: 250.0000 mL | INTRAVENOUS | Status: DC | PRN

## 2016-02-04 MED ORDER — HEPARIN SODIUM (PORCINE) 5000 UNIT/ML IJ SOLN
4000.0000 [IU] | INTRAMUSCULAR | Status: AC
  Administered 2016-02-04: 4000 [IU] via INTRAVENOUS
  Filled 2016-02-04: qty 0.8

## 2016-02-04 MED ORDER — SODIUM CHLORIDE 0.9 % IV BOLUS (SEPSIS): 1000.0000 mL | Freq: Once | INTRAVENOUS | Status: DC

## 2016-02-04 MED ORDER — DOXYCYCLINE HYCLATE 100 MG IV SOLR
100.0000 mg | Freq: Two times a day (BID) | INTRAVENOUS | Status: DC
  Administered 2016-02-05: 100 mg via INTRAVENOUS
  Filled 2016-02-04 (×3): qty 100

## 2016-02-04 MED ORDER — ASPIRIN 81 MG PO CHEW
81.0000 mg | CHEWABLE_TABLET | Freq: Every day | ORAL | Status: DC
  Administered 2016-02-05 – 2016-02-07 (×3): 81 mg via ORAL
  Filled 2016-02-04 (×3): qty 1

## 2016-02-04 MED ORDER — SODIUM CHLORIDE 0.9 % IV SOLN
1.0000 g | Freq: Once | INTRAVENOUS | Status: AC
  Administered 2016-02-04: 1 g via INTRAVENOUS
  Filled 2016-02-04: qty 10

## 2016-02-04 MED ORDER — METHYLPREDNISOLONE SODIUM SUCC 125 MG IJ SOLR
125.0000 mg | Freq: Once | INTRAMUSCULAR | Status: AC
  Administered 2016-02-04: 125 mg via INTRAVENOUS
  Filled 2016-02-04: qty 2

## 2016-02-04 MED ORDER — ALBUTEROL (5 MG/ML) CONTINUOUS INHALATION SOLN
10.0000 mg/h | INHALATION_SOLUTION | Freq: Once | RESPIRATORY_TRACT | Status: AC
  Administered 2016-02-04: 10 mg/h via RESPIRATORY_TRACT
  Filled 2016-02-04: qty 20

## 2016-02-04 MED ORDER — HEPARIN (PORCINE) IN NACL 100-0.45 UNIT/ML-% IJ SOLN
750.0000 [IU]/h | INTRAMUSCULAR | Status: DC
  Administered 2016-02-04 – 2016-02-07 (×3): 750 [IU]/h via INTRAVENOUS
  Filled 2016-02-04 (×4): qty 250

## 2016-02-04 MED ORDER — IPRATROPIUM-ALBUTEROL 0.5-2.5 (3) MG/3ML IN SOLN
3.0000 mL | Freq: Four times a day (QID) | RESPIRATORY_TRACT | Status: DC
  Administered 2016-02-04 – 2016-02-07 (×12): 3 mL via RESPIRATORY_TRACT
  Filled 2016-02-04 (×11): qty 3

## 2016-02-04 MED ORDER — DOXYCYCLINE HYCLATE 100 MG IV SOLR
100.0000 mg | Freq: Once | INTRAVENOUS | Status: AC
  Administered 2016-02-04: 100 mg via INTRAVENOUS
  Filled 2016-02-04: qty 100

## 2016-02-04 MED ORDER — ASPIRIN 81 MG PO CHEW: 324.0000 mg | CHEWABLE_TABLET | Freq: Once | ORAL | Status: DC

## 2016-02-04 NOTE — Progress Notes (Signed)
ANTICOAGULATION CONSULT NOTE - Initial Consult  Pharmacy Consult for heparin Indication: chest pain/ACS    Patient Measurements: weight= 63 kg ; height 69 inches   Heparin Dosing Weight: 63 kg  Vital Signs: Temp: 98.4 F (36.9 C) (08/31 1335) Temp Source: Oral (08/31 1335) BP: 109/94 (08/31 1335) Pulse Rate: 114 (08/31 1335)  Labs:  Recent Labs  01/14/2016 1355  HGB 11.7*  HCT 34.3*  PLT 201  APTT 32  LABPROT 14.3  INR 1.11  CREATININE 1.60*  1.70*  TROPONINI 5.18*    CrCl cannot be calculated (Unknown ideal weight.).   Medical History: Past Medical History:  Diagnosis Date  . Asthma   . COPD (chronic obstructive pulmonary disease) (HCC)   . Depression   . Dyslipidemia   . GERD (gastroesophageal reflux disease)   . Headache(784.0)   . History of psychosis LAST DOCMENTATION ADMISSION  FOR PSYCHOSIS/ DEPRESSION  WITH SCHIZOPHRENIA   02-25-2010  . Iron deficiency anemia   . OA (osteoarthritis)   . Penile pain SECONDARY TO SEVERE PHIMOSIS  . Personal history of schizophrenia   . Phimosis SEVERE  . Wheelchair dependent CAN PIVET W/ ASSIST      Assessment: Patient is an 82 y.o M [facility] with c/o SOB and tachypnea.  To start heparin for r/o ACS.  Admit INR, aPTT wnl.   Goal of Therapy:  Heparin level 0.3-0.7 units/ml Monitor platelets by anticoagulation protocol: Yes   Plan:  - heparin 4000 units IV x1 ordered by MD-- given at 1443, will start heparin drip at 750 units/hr - check 8 hour heparin level - monitor for s/s bleeding  Keirra Zeimet P 02/03/2016,3:15 PM

## 2016-02-04 NOTE — ED Notes (Signed)
Report given to Fleet Contrasachel, Charity fundraiserN and spoke to [employer] [facility]  for transport.

## 2016-02-04 NOTE — ED Triage Notes (Addendum)
Per EMS, Pt, from [facility], c/o SOB and tachypnea x "a couple hours" and neck pain since last week.  Pain score 5/10.  Denies injury.  Hx of COPD and chronic bronchitis.  The facility did not reports any treatments prior to transport.

## 2016-02-04 NOTE — ED Notes (Signed)
Bed: WA23 Expected date:  Expected time:  Means of arrival:  Comments: EMS-SOB 

## 2016-02-04 NOTE — ED Provider Notes (Addendum)
[facility]-EMERGENCY DEPT Provider Note   CSN: [id number] Arrival date & time: 02/03/2016  1323     History   Chief Complaint Chief Complaint  Patient presents with  . Shortness of Breath  . Neck Pain    HPI Chase Benjamin is a 82 y.o. male.  HPI 82 y.o males comes from SNF. Hx of COPD, asthma, HL. Denies any hx of CAD, strokes.Pt is not the best of historian. Pt reports that he started having dib this morning. He has no chest pain with it. With the DIB pt has no cough. He denies any COPD hx. He denies any wheezing. Reports symptoms started after he woke up. Pt has neck pain x 2 days on the R side. Pain is constant and worse with movement.  Past Medical History:  Diagnosis Date  . Asthma   . COPD (chronic obstructive pulmonary disease) (HCC)   . Depression   . Dyslipidemia   . GERD (gastroesophageal reflux disease)   . Headache(784.0)   . History of psychosis LAST DOCMENTATION ADMISSION  FOR PSYCHOSIS/ DEPRESSION  WITH SCHIZOPHRENIA   02-25-2010  . Iron deficiency anemia   . OA (osteoarthritis)   . Penile pain SECONDARY TO SEVERE PHIMOSIS  . Personal history of schizophrenia   . Phimosis SEVERE  . Wheelchair dependent CAN PIVET W/ ASSIST    Patient Active Problem List   Diagnosis Date Noted  . NSTEMI (non-ST elevated myocardial infarction) (HCC) 01/22/2016    Past Surgical History:  Procedure Laterality Date  . CIRCUMCISION  06/01/2012   Procedure: CIRCUMCISION ADULT;  Surgeon: Lindaann Slough, MD;  Location: [facility];  Service: Urology;  Laterality: N/A;      . TOTAL HIP ARTHROPLASTY  11-15-2001  DR DALLDORF   RIGHT        Home Medications    Prior to Admission medications   Medication Sig Start Date End Date Taking? Authorizing Provider  acetaminophen (TYLENOL) 500 MG tablet Take 2 tablets (1,000 mg total) by mouth 2 (two) times daily as needed for mild pain. 07/09/15   Gerhard Munch, MD  aspirin EC 81 MG tablet Take 81 mg by mouth daily.     Historical Provider, MD  cholecalciferol (VITAMIN D) 1000 UNITS tablet Take 1,000 Units by mouth every morning.     Historical Provider, MD  citalopram (CELEXA) 20 MG tablet Take 20 mg by mouth every morning.     Historical Provider, MD  cyclobenzaprine (FLEXERIL) 5 MG tablet Take 5 mg by mouth at bedtime.    Historical Provider, MD  fenofibrate micronized (LOFIBRA) 200 MG capsule Take 200 mg by mouth daily before breakfast.    Historical Provider, MD  ferrous sulfate 325 (65 FE) MG tablet Take 325 mg by mouth daily with breakfast.    Historical Provider, MD  folic acid (FOLVITE) 800 MCG tablet Take 800 mcg by mouth every morning.     Historical Provider, MD  HYDROcodone-acetaminophen (NORCO/VICODIN) 5-325 MG tablet Take 1 tablet by mouth every 6 (six) hours as needed for moderate pain. 07/15/15   Bethann Berkshire, MD  Menthol, Topical Analgesic, (ICY HOT EX) Apply 1 patch topically 4 (four) times daily as needed (for pain).    Historical Provider, MD  nitroGLYCERIN (NITROSTAT) 0.4 MG SL tablet Place 0.4 mg under the tongue every 5 (five) minutes as needed for chest pain.     Historical Provider, MD  OLANZapine (ZYPREXA) 2.5 MG tablet Take 2.5 mg by mouth every morning.    Historical Provider, MD  Phenylephrine-DM-GG (QC TUSSIN CF) 5-10-100 MG/5ML LIQD Take 10 mLs by mouth every 6 (six) hours as needed (cough).    Historical Provider, MD  pyridOXINE (VITAMIN B-6) 50 MG tablet Take 25 mg by mouth every morning.     Historical Provider, MD  tiotropium (SPIRIVA) 18 MCG inhalation capsule Place 18 mcg into inhaler and inhale every morning.    Historical Provider, MD  traMADol (ULTRAM) 50 MG tablet Take 0.5 tablets (25 mg total) by mouth every 12 (twelve) hours as needed for severe pain. 07/09/15   Gerhard Munch, MD    Family History History reviewed. No pertinent family history.  Social History Social History  Substance Use Topics  . Smoking status: Current Every Day Smoker    Packs/day: 0.25     Types: Cigarettes  . Smokeless tobacco: Never Used  . Alcohol use No     Allergies   Penicillins   Review of Systems Review of Systems  ROS 10 Systems reviewed and are negative for acute change except as noted in the HPI.     Physical Exam Updated Vital Signs BP 100/72 (BP Location: Left Arm)   Pulse 101   Temp 98.4 F (36.9 C) (Oral)   Resp 19   Ht 5\' 9"  (1.753 m)   Wt 140 lb (63.5 kg)   SpO2 100%   BMI 20.67 kg/m   Physical Exam  Constitutional: He is oriented to person, place, and time. He appears well-developed.  HENT:  Head: Atraumatic.  Neck: Neck supple.  Cardiovascular: Normal rate, regular rhythm and intact distal pulses.   No murmur heard. Pulmonary/Chest: He is in respiratory distress. He has rales.  TACHYPNEA  Abdominal: Soft. He exhibits no distension. There is no tenderness.  Musculoskeletal: He exhibits no edema or tenderness.  Neurological: He is alert and oriented to person, place, and time.  Skin: Skin is warm.  Nursing note and vitals reviewed.    ED Treatments / Results  Labs (all labs ordered are listed, but only abnormal results are displayed) Labs Reviewed  BASIC METABOLIC PANEL - Abnormal; Notable for the following:       Result Value   Chloride 112 (*)    CO2 21 (*)    Glucose, Bld 168 (*)    BUN 30 (*)    Creatinine, Ser 1.70 (*)    Calcium 8.8 (*)    GFR calc non Af Amer 36 (*)    GFR calc Af Amer 42 (*)    All other components within normal limits  CBC - Abnormal; Notable for the following:    RBC 3.07 (*)    Hemoglobin 11.7 (*)    HCT 34.3 (*)    MCV 111.7 (*)    MCH 38.1 (*)    All other components within normal limits  COMPREHENSIVE METABOLIC PANEL - Abnormal; Notable for the following:    CO2 21 (*)    Glucose, Bld 166 (*)    BUN 29 (*)    Creatinine, Ser 1.60 (*)    Calcium 8.7 (*)    AST 45 (*)    GFR calc non Af Amer 39 (*)    GFR calc Af Amer 45 (*)    All other components within normal limits    TROPONIN I - Abnormal; Notable for the following:    Troponin I 5.18 (*)    All other components within normal limits  TROPONIN I - Abnormal; Notable for the following:    Troponin I 5.79 (*)  All other components within normal limits  BLOOD GAS, ARTERIAL - Abnormal; Notable for the following:    pH, Arterial 7.221 (*)    pO2, Arterial 72.8 (*)    Bicarbonate 16.2 (*)    Acid-base deficit 10.7 (*)    All other components within normal limits  BRAIN NATRIURETIC PEPTIDE - Abnormal; Notable for the following:    B Natriuretic Peptide 872.2 (*)    All other components within normal limits  I-STAT CG4 LACTIC ACID, ED - Abnormal; Notable for the following:    Lactic Acid, Venous 5.53 (*)    All other components within normal limits  PROTIME-INR  APTT  HEPARIN LEVEL (UNFRACTIONATED)  CBC  CBC  BASIC METABOLIC PANEL  MAGNESIUM  PHOSPHORUS  TROPONIN I  TROPONIN I  LACTIC ACID, PLASMA  I-STAT TROPOININ, ED    EKG  EKG Interpretation  Date/Time:  Thursday 02-16-2016 13:50:43 EDT Ventricular Rate:  114 PR Interval:    QRS Duration: 116 QT Interval:  323 QTC Calculation: 445 R Axis:   54 Text Interpretation:  Sinus tachycardia Nonspecific intraventricular conduction delay ST depression in the inferior and lateral leads, with ST elevation in aVR EKG changes are new Confirmed by Rhunette Croft, MD, Jandi Swiger [phone]) on 02/16/2016 2:24:11 PM        EKG Interpretation  Date/Time:  Thursday 02-16-2016 15:07:03 EDT Ventricular Rate:  105 PR Interval:    QRS Duration: 111 QT Interval:  333 QTC Calculation: 441 R Axis:   55 Text Interpretation:  Sinus tachycardia Repol abnrm suggests ischemia, diffuse leads no new changes persistent ST depressions in the inferior and lagteral leads Confirmed by Rhunette Croft, MD, Marven Veley [phone]) on February 16, 2016 5:31:10 PM       EKG Interpretation  Date/Time:  Thursday 16-Feb-2016 15:07:03 EDT Ventricular Rate:  105 PR Interval:    QRS  Duration: 111 QT Interval:  333 QTC Calculation: 441 R Axis:   55 Text Interpretation:  Sinus tachycardia Repol abnrm suggests ischemia, diffuse leads no new changes persistent ST depressions in the inferior and lagteral leads Confirmed by Rhunette Croft, MD, Janey Genta [phone]) on 02/16/2016 5:31:10 PM        Radiology Dg Chest Port 1 View  Result Date: 2016-02-16 CLINICAL DATA:  Shortness of breath.  Tachypnea. EXAM: PORTABLE CHEST 1 VIEW COMPARISON:  02/05/2014 FINDINGS: Chronically elevated right hemidiaphragm with associated scarring at the right lung base and right mid lung. Tortuous thoracic aorta. Heart size within normal limits. There is some slight worsening of the perihilar density on the right compared to 02/05/14. Airway thickening is present, suggesting bronchitis or reactive airways disease. Interstitial accentuation increased from prior with some indistinctness of pulmonary vasculature. Kerley B-lines observed. Tapering of the peripheral pulmonary vasculature favors emphysema. IMPRESSION: 1. Interstitial accentuation with Kerley B-lines, typically associated with interstitial pulmonary edema, although the heart contour is not enlarged. Possibility of atypical pneumonia is accordingly raised. 2. Perihilar and right basilar scarring, slightly increased in the perihilar region compared to the 2015 comparison exam. Chronically elevated right hemidiaphragm. 3. Emphysema. Electronically Signed   By: Gaylyn Rong M.D.   On: 2016-02-16 14:21    Procedures .Critical Care Performed by: Derwood Kaplan Authorized by: Derwood Kaplan   Critical care provider statement:    Critical care time (minutes):  120   Critical care time was exclusive of:  Separately billable procedures and treating other patients   Critical care was necessary to treat or prevent imminent or life-threatening deterioration of the following  conditions:  Respiratory failure   Critical care was time spent personally by me on  the following activities:  Blood draw for specimens, development of treatment plan with patient or surrogate, discussions with consultants, evaluation of patient's response to treatment, examination of patient, interpretation of cardiac output measurements, obtaining history from patient or surrogate, ordering and performing treatments and interventions, ordering and review of laboratory studies, ordering and review of radiographic studies, pulse oximetry, re-evaluation of patient's condition, review of old charts and ventilator management    (including critical care time)  Medications Ordered in ED Medications  0.9 %  sodium chloride infusion (10 mL/hr Intravenous New Bag/Given 06-20-2015 1443)  heparin ADULT infusion 100 units/mL (25000 units/25050mL sodium chloride 0.45%) (750 Units/hr Intravenous New Bag/Given 06-20-2015 1611)  doxycycline (VIBRAMYCIN) 100 mg in dextrose 5 % 250 mL IVPB (100 mg Intravenous New Bag/Given 06-20-2015 1855)  doxycycline (VIBRAMYCIN) 100 mg in dextrose 5 % 250 mL IVPB (not administered)  0.9 %  sodium chloride infusion (not administered)  0.9 %  sodium chloride infusion (not administered)  aspirin chewable tablet 324 mg (324 mg Oral Given 06-20-2015 1436)  heparin injection 4,000 Units (4,000 Units Intravenous Given 06-20-2015 1443)  albuterol (PROVENTIL,VENTOLIN) solution continuous neb (10 mg/hr Nebulization Given 06-20-2015 1521)  methylPREDNISolone sodium succinate (SOLU-MEDROL) 125 mg/2 mL injection 125 mg (125 mg Intravenous Given 06-20-2015 1525)     Initial Impression / Assessment and Plan / ED Course  I have reviewed the triage vital signs and the nursing notes.  Pertinent labs & imaging results that were available during my care of the patient were reviewed by me and considered in my medical decision making (see chart for details).  Clinical Course  Comment By Time  Pt comes in with cc of dib. EKG - ST elevation in lead aVR, with depression in the inferior and lateral  leads. Pt has no chest pain. Reports DIB. Posterior ekg shot, although there is no depression in the v1-v3, which is typical of posterior STEMI, and it is neg. Will get CXR, lungs have crackles all around and then call Cardiologist. Derwood KaplanAnkit Rajanee Schuelke, MD 08/31 1407  DR. Swazil[place]JORDAN FROM Cardiology called back immediately, appreciate his help. We went over the cc and presentation of the patient and the concerning ST depression in the inferior and lateral leads with ST elevation in aVR. Pt on repeated query has denies chest pain, and we did posterior ekg which reveals no acute changes in v4R-v6R.  Dr. [place]Jordan recommends no STEMI activation. He recommends expectant care, and really no need for transfer to [facility] if the troponins are stable and not rising. I appreciate Dr. Elvis CoilJordan's recommendation, and his recs confirms my gestalt that STEMI doesn't need to be called. That being said, the ST depression do mean possible ischemic changes, which could be even chronic slow progression, so we will get serial trops. Now the DIB appears to be likely from COPD. We will start nebs and reassess. Derwood KaplanAnkit Delainey Winstanley, MD 08/31 1418  Troponin is elevated. EKG repeated. No new dynamic changes, but the inferior and lateral leads continues to have significant ST depression and avR still has isolated 1 mm ST elevation. Troponin of > 5 - likely NSTEMI. Dr. [place] agrees. He would want medicine to admit though and he doesn't recommend emergent CATH - so STEMI will not be activated. Pt reassessed. Still has no chest pain. Dyspnea considered angina equivalent in his case. We will start heparin and aspirin. Posterior ekg as mentioned earlier is negative.  Derwood Kaplan, MD 08/31 1515  Hospitalist would prefer a 2nd trop before they would admit. Pt reassessed. His breathing is worse. Still has coarse breath sounds. S/p albuterol. Will start on bipap. ABG ordered. CXR raises suspicion for infection - we will start antibiotics empirically,  but we don't think pt has infection. CHF appears more likely to be the cause. Bipap will help. ABG to effectively r/o CO2 retention. Will continue to monitor closely. 2nd trop pending. Aspirin and heparin ordered. Derwood Kaplan, MD 08/31 1721  Critical care to admit. They have been asked to page Cards once patient gets to [facility]. PT doing better on bipap. ABG shows metabolic acidosis. Lactate ordered. Derwood Kaplan, MD 08/31 1905  The patient is noted to have a lactate>4. With the current information available to me, I don't think the patient is in septic shock. The lactate>4, is related to respiratory distress/ respiratory failure due to possible COPD or CHF.  Derwood Kaplan, MD 08/31 1907      Final Clinical Impressions(s) / ED Diagnoses   Final diagnoses:  NSTEMI (non-ST elevated myocardial infarction) [facility])  Respiratory distress  Metabolic acidosis    New Prescriptions New Prescriptions   No medications on file     Derwood Kaplan, MD 01/29/2016 1907    Derwood Kaplan, MD 01/17/2016 1910

## 2016-02-04 NOTE — H&P (Signed)
PULMONARY / CRITICAL CARE MEDICINE   Name: Chase Benjamin MRN: [id number] DOB: 01/22/1932    ADMISSION DATE:  29-Sep-2015 CONSULTATION DATE:  December 10, 2015  REFERRING MD:  Rhunette CroftNanavati - EDP  CHIEF COMPLAINT:  SOB  HISTORY OF PRESENT ILLNESS:  Pt is encephelopathic; therefore, this HPI is obtained from chart review. Chase Benjamin is a 82 y.o. male with PMH as outlined below and who [facility] ([facility] -  called daughter Chase CornfieldStephanie but no answer, also called facility but no answer).  [facility] with SOB and tachypnea as well as neck pain.  Symptoms had apparently started after he woke up and were worse with movement.  In ED, he had tachypnea and mild increase in WOB so was started on BiPAP.  CXR revealed pulmonary edema.  Trop elevated at 5 and EKG with ST depressions.  Due to being on BiPAP, PCCM was asked to admit pt for NSTEMI.   PAST MEDICAL HISTORY :  Past Medical History:  Diagnosis Date  . Asthma   . BPH (benign prostatic hyperplasia)    fax[facility]  . COPD (chronic obstructive pulmonary disease) (HCC)   . Depression   . Dyslipidemia   . GERD (gastroesophageal reflux disease)   . Headache(784.0)   . History of psychosis LAST DOCMENTATION ADMISSION  FOR PSYCHOSIS/ DEPRESSION  WITH SCHIZOPHRENIA   02-25-2010  . Iron deficiency anemia   . Muscle weakness    from records [facility] 025-Apr-2017  . OA (osteoarthritis)   . Penile pain SECONDARY TO SEVERE PHIMOSIS  . Personal history of schizophrenia   . Phimosis SEVERE  . Urinary incontinence    from records [facility] 025-Apr-2017  . Wheelchair dependent CAN PIVET W/ ASSIST    PAST SURGICAL HISTORY: Past Surgical History:  Procedure Laterality Date  . CIRCUMCISION  06/01/2012   Procedure: CIRCUMCISION ADULT;  Surgeon: Lindaann SloughMarc-Henry Nesi, MD;  [facility] [place];  Service: Urology;  Laterality: N/A;      . TOTAL HIP  ARTHROPLASTY  11-15-2001  DR DALLDORF   RIGHT     ALLERGIES:  Penicillin (rash)  No current facility-administered medications on file prior to encounter.    Current Outpatient Prescriptions on File Prior to Encounter  Medication Sig  . acetaminophen (TYLENOL) 500 MG tablet Take 2 tablets (1,000 mg total) by mouth 2 (two) times daily as needed for mild pain.  Marland Kitchen. aspirin EC 81 MG tablet Take 81 mg by mouth daily.  . cholecalciferol (VITAMIN D) 1000 UNITS tablet Take 1,000 Units by mouth every morning.   . citalopram (CELEXA) 20 MG tablet Take 20 mg by mouth every morning.   . cyclobenzaprine (FLEXERIL) 5 MG tablet Take 5 mg by mouth at bedtime.  . fenofibrate micronized (LOFIBRA) 200 MG capsule Take 200 mg by mouth daily before breakfast.  . ferrous sulfate 325 (65 FE) MG tablet Take 325 mg by mouth daily with breakfast.  . folic acid (FOLVITE) 800 MCG tablet Take 800 mcg by mouth every morning.   Marland Kitchen. HYDROcodone-acetaminophen (NORCO/VICODIN) 5-325 MG tablet Take 1 tablet by mouth every 6 (six) hours as needed for moderate pain.  . Menthol, Topical Analgesic, (ICY HOT EX) Apply 1 patch topically 4 (four) times daily as needed (for pain).  . nitroGLYCERIN (NITROSTAT) 0.4 MG SL tablet Place 0.4 mg under the tongue every 5 (five) minutes as needed for chest pain.   .Marland Kitchen  OLANZapine (ZYPREXA) 2.5 MG tablet Take 2.5 mg by mouth every morning.  Marland Kitchen Phenylephrine-DM-GG (QC TUSSIN CF) 5-10-100 MG/5ML LIQD Take 10 mLs by mouth every 6 (six) hours as needed (cough).  . pyridOXINE (VITAMIN B-6) 50 MG tablet Take 25 mg by mouth every morning.   . tiotropium (SPIRIVA) 18 MCG inhalation capsule Place 18 mcg into inhaler and inhale every morning.  . traMADol (ULTRAM) 50 MG tablet Take 0.5 tablets (25 mg total) by mouth every 12 (twelve) hours as needed for severe pain.    FAMILY HISTORY:  Unable to obtain family history with patient on BiPAP.  SOCIAL HISTORY: Social History  Substance Use Topics  . Smoking  status: Current Every Day Smoker    Packs/day: 0.25    Types: Cigarettes  . Smokeless tobacco: Never Used  . Alcohol use No    REVIEW OF SYSTEMS:   Unable to obtain as pt is encephalopathic.  SUBJECTIVE: On BiPAP, opens eyes but does not answer questions.  VITAL SIGNS: BP 100/72 (BP Location: Left Arm)   Pulse 101   Temp 98.4 F (36.9 C) (Oral)   Resp 19   Ht 5\' 9"  (1.753 m)   Wt 140 lb (63.5 kg)   SpO2 100%   BMI 20.67 kg/m   HEMODYNAMICS:    VENTILATOR SETTINGS: Vent Mode: BIPAP FiO2 (%):  [50 %-96 %] 50 % Set Rate:  [12 bmp] 12 bmp PEEP:  [5 cmH20] 5 cmH20  INTAKE / OUTPUT: No intake/output data recorded.   PHYSICAL EXAMINATION: General: Elderly male, in NAD. Neuro: Opens eyes to voice but does not answer any questions. HEENT: [place]/AT. PERRL, sclerae anicteric.  BiPAP in place. Cardiovascular: Tachy, regular, no M/R/G.  Lungs: Respirations even and unlabored.  Faint crackles. Abdomen: BS x 4, soft, NT/ND.  Musculoskeletal: No gross deformities, no edema.  Skin: Intact, warm, no rashes.  LABS:  BMET  Recent Labs Lab 01/05/2016 1355  NA 141  142  K 3.9  3.9  CL 111  112*  CO2 21*  21*  BUN 29*  30*  CREATININE 1.60*  1.70*  GLUCOSE 166*  168*    Electrolytes  Recent Labs Lab 01/13/2016 1355  CALCIUM 8.7*  8.8*    CBC  Recent Labs Lab 01/31/2016 1355  WBC 10.1  HGB 11.7*  HCT 34.3*  PLT 201    Coag's  Recent Labs Lab 01/20/2016 1355  APTT 32  INR 1.11    Sepsis Markers  Recent Labs Lab 01/22/2016 1901  LATICACIDVEN 5.53*    ABG  Recent Labs Lab 01/15/2016 1728  PHART 7.221*  PCO2ART 40.9  PO2ART 72.8*    Liver Enzymes  Recent Labs Lab 01/22/2016 1355  AST 45*  ALT 23  ALKPHOS 47  BILITOT 1.0  ALBUMIN 3.5    Cardiac Enzymes  Recent Labs Lab 01/08/2016 1355 01/07/2016 1644  TROPONINI 5.18* 5.79*    Glucose No results for input(s): GLUCAP in the last 168 hours.  Imaging Dg Chest Port 1  View  Result Date: 01/30/2016 CLINICAL DATA:  Shortness of breath.  Tachypnea. EXAM: PORTABLE CHEST 1 VIEW COMPARISON:  02/05/2014 FINDINGS: Chronically elevated right hemidiaphragm with associated scarring at the right lung base and right mid lung. Tortuous thoracic aorta. Heart size within normal limits. There is some slight worsening of the perihilar density on the right compared to 02/05/14. Airway thickening is present, suggesting bronchitis or reactive airways disease. Interstitial accentuation increased from prior with some indistinctness of pulmonary vasculature. Kerley B-lines observed.  Tapering of the peripheral pulmonary vasculature favors emphysema. IMPRESSION: 1. Interstitial accentuation with Kerley B-lines, typically associated with interstitial pulmonary edema, although the heart contour is not enlarged. Possibility of atypical pneumonia is accordingly raised. 2. Perihilar and right basilar scarring, slightly increased in the perihilar region compared to the 2015 comparison exam. Chronically elevated right hemidiaphragm. 3. Emphysema. Electronically Signed   By: Gaylyn Rong M.D.   On: 01/17/2016 14:21    STUDIES:  CXR 8/31 > pulmonary edema.  CULTURES: None.  ANTIBIOTICS: None.  SIGNIFICANT EVENTS: 8/31 > admitted with NSTEMI.  LINES/TUBES: PIVx2  DISCUSSION: 82 y.o. male from SNF, admitted 8/31 with NSTEMI.  Started on BiPAP for pulmonary edema and mildly increased WOB.  ASSESSMENT / PLAN:  CARDIOVASCULAR A:  NSTEMI. Hx HLD. P:  Cardiology called by EDP, they will see once [facility] at [facility]. Continue heparin gtt. Trend troponins, lactate. Continue daily ASA.  PULMONARY A: Pulmonary edema. Dyspnea with mildly increased WOB. Hx COPD per report - no PFT's in system. P:   Continue BiPAP for now. Lasix restricted due to borderline BP's. Low threshold for intubation if declines. DuoNebs q6hr in lieu of outpatient spiriva. CXR in AM.  RENAL A:   Acute on  Chronic Renal Failure - Last Creatinine 1.4 on 07/15/15. NAGMA - due to hyperventilation. Hypocalcemia. P:   NS @ 50. 1g Ca gluconate. BMP in AM.  GASTROINTESTINAL A:   GERD. Nutrition. P:   Hold Protonix NPO.  HEMATOLOGIC A:   VTE Prophylaxis. P:  SCD's / Heparin gtt. CBC in AM.  INFECTIOUS A:   Possible Acute COPD Exac P:   Empiric Doxycycline w/ low threshold to de-escalate  ENDOCRINE A:   Hyperglycemia - No h/o DM. P:   Checking Hgb A1c SSI per Sensitive Algorithm Accu-Checks q4hr  NEUROLOGIC A:   Acute encephalopathy - unknown baseline. Hx schizophrenia, depression. P:   Monitor clinically. Avoid sedating meds. Hold preadmission citalopram, cyclobenzaprine, norco, olanzapine.  Family updated: Attempted to call daughter Chase Benjamin; however, no answer.  Also attempted to call pt's SNF [facility]) but also no answer.  Will list as full code until contact can be made with facility or family to inquire on whether or not pt had any advanced directives.  Interdisciplinary Family Meeting v Palliative Care Meeting:  Due by: 02/10/16.  CC time: 35 minutes.   Rutherford Guys, [place] [facility] Pulmonary & Critical Care Medicine Pager: [phone]  or [phone] 01/07/2016, 7:07 PM  PCCM Attending Note: Patient seen and examined. Please refer to admission H&P by physician assistant which I have reviewed in detail. Patient presenting with what appears to be non-ST elevation myocardial infarction. Troponin I elevated. Cardiology notified and case discussed at bedside with on-call physician. Plan for left heart catheterization in the morning. Continuing aspirin and heparin infusion per pharmacy protocol. Checking TSH, hemoglobin A1c, & lipid panel. Starting sliding scale insulin per sensitive algorithm given hyperglycemia. Continuing BiPAP as needed for hypoxia and increased work of breathing. Continuing scheduled Duonebs. Also continuing empiric  doxycycline with low threshold to de-escalate antibiotic therapy. Patient remains full CODE STATUS. Nurse reports she was able to contact daughter after arrival in the intensive care unit. Continuing current plan of care. Discontinuing morphine as the patient currently has no pain whatsoever. Currently he is following commands but is sleepy.  I have spent a total of 32 minutes of critical care time today caring for the patient, discussing the plan of  care with cardiology, and reviewing the patient's electronic medical record.  Donna Christen Jamison Neighbor, M.D. [facility] Pulmonary & Critical Care Pager:  [phone] After 3pm or if no response, call [phone] 12:47 AM 02/05/16

## 2016-02-04 NOTE — ED Notes (Signed)
132 lbs  Verbal

## 2016-02-04 NOTE — Progress Notes (Signed)
Pt's daughter George HughStephanie Legrande was called, notified of pt's hospitalization. Said she doesn't drive so won't visit until tomorrow. Also called [facility]where [facility] and made sure they were aware of pt's hospitalization. They faxed over a current copy of pt's medication list. Their # is [phone] if anything else is needed.  Peyton Bottomsachel R Kadi Hession, RN 11:41 PM 01/16/2016

## 2016-02-04 NOTE — Progress Notes (Signed)
[facility] called, camera'd in, notified of admission.  Pt currently calm, on BiPAP, VSS, no apparent distress. Able to move all extremities.  Will continue to monitor.  Peyton Bottomsachel R Darrian Goodwill, RN 10:43 PM 07/20/2015

## 2016-02-05 ENCOUNTER — Inpatient Hospital Stay (HOSPITAL_COMMUNITY): Payer: Medicare Other

## 2016-02-05 DIAGNOSIS — I214 Non-ST elevation (NSTEMI) myocardial infarction: Secondary | ICD-10-CM

## 2016-02-05 DIAGNOSIS — I213 ST elevation (STEMI) myocardial infarction of unspecified site: Secondary | ICD-10-CM

## 2016-02-05 DIAGNOSIS — J9621 Acute and chronic respiratory failure with hypoxia: Secondary | ICD-10-CM

## 2016-02-05 DIAGNOSIS — J9620 Acute and chronic respiratory failure, unspecified whether with hypoxia or hypercapnia: Secondary | ICD-10-CM

## 2016-02-05 LAB — CBC
HEMATOCRIT: 35.4 % — AB (ref 39.0–52.0)
Hemoglobin: 11.7 g/dL — ABNORMAL LOW (ref 13.0–17.0)
MCH: 37.5 pg — AB (ref 26.0–34.0)
MCHC: 33.1 g/dL (ref 30.0–36.0)
MCV: 113.5 fL — AB (ref 78.0–100.0)
PLATELETS: 181 10*3/uL (ref 150–400)
RBC: 3.12 MIL/uL — AB (ref 4.22–5.81)
RDW: 13.3 % (ref 11.5–15.5)
WBC: 16.7 10*3/uL — ABNORMAL HIGH (ref 4.0–10.5)

## 2016-02-05 LAB — BASIC METABOLIC PANEL
Anion gap: 11 (ref 5–15)
BUN: 31 mg/dL — AB (ref 6–20)
CHLORIDE: 108 mmol/L (ref 101–111)
CO2: 24 mmol/L (ref 22–32)
CREATININE: 1.88 mg/dL — AB (ref 0.61–1.24)
Calcium: 9.3 mg/dL (ref 8.9–10.3)
GFR calc Af Amer: 37 mL/min — ABNORMAL LOW (ref >60)
GFR calc non Af Amer: 32 mL/min — ABNORMAL LOW (ref >60)
Glucose, Bld: 224 mg/dL — ABNORMAL HIGH (ref 65–99)
POTASSIUM: 3.9 mmol/L (ref 3.5–5.1)
Sodium: 143 mmol/L (ref 135–145)

## 2016-02-05 LAB — GLUCOSE, CAPILLARY
GLUCOSE-CAPILLARY: 147 mg/dL — AB (ref 65–99)
GLUCOSE-CAPILLARY: 136 mg/dL — AB (ref 65–99)
Glucose-Capillary: 220 mg/dL — ABNORMAL HIGH (ref 65–99)
Glucose-Capillary: 174 mg/dL — ABNORMAL HIGH (ref 65–99)
Glucose-Capillary: 98 mg/dL (ref 65–99)
Glucose-Capillary: 122 mg/dL — ABNORMAL HIGH (ref 65–99)
Glucose-Capillary: 128 mg/dL — ABNORMAL HIGH (ref 65–99)

## 2016-02-05 LAB — ECHOCARDIOGRAM COMPLETE
HEIGHTINCHES: 69 in
WEIGHTICAEL: 2134.05 [oz_av]

## 2016-02-05 LAB — LIPID PANEL
CHOL/HDL RATIO: 2 ratio
Cholesterol: 104 mg/dL (ref 0–200)
HDL: 53 mg/dL (ref >40)
LDL CALC: 42 mg/dL (ref 0–99)
Triglycerides: 46 mg/dL (ref <150)
VLDL: 9 mg/dL (ref 0–40)

## 2016-02-05 LAB — TROPONIN I
Troponin I: 4.95 ng/mL (ref <0.03)
Troponin I: 6.17 ng/mL (ref <0.03)

## 2016-02-05 LAB — MRSA PCR SCREENING: MRSA BY PCR: NEGATIVE

## 2016-02-05 LAB — LACTIC ACID, PLASMA
Lactic Acid, Venous: 4.5 mmol/L (ref 0.5–1.9)
Lactic Acid, Venous: 3.5 mmol/L (ref 0.5–1.9)
Lactic Acid, Venous: 2.2 mmol/L (ref 0.5–1.9)

## 2016-02-05 LAB — HEPARIN LEVEL (UNFRACTIONATED)
HEPARIN UNFRACTIONATED: 0.69 [IU]/mL (ref 0.30–0.70)
Heparin Unfractionated: 0.48 IU/mL (ref 0.30–0.70)

## 2016-02-05 LAB — MAGNESIUM: Magnesium: 2.4 mg/dL (ref 1.7–2.4)

## 2016-02-05 LAB — PHOSPHORUS: Phosphorus: 3.1 mg/dL (ref 2.5–4.6)

## 2016-02-05 LAB — TSH: TSH: 1.711 u[IU]/mL (ref 0.350–4.500)

## 2016-02-05 MED ORDER — CLOPIDOGREL BISULFATE 75 MG PO TABS
300.0000 mg | ORAL_TABLET | Freq: Once | ORAL | Status: AC
  Administered 2016-02-05: 300 mg via ORAL
  Filled 2016-02-05: qty 4

## 2016-02-05 MED ORDER — ACETAMINOPHEN 325 MG PO TABS
650.0000 mg | ORAL_TABLET | Freq: Four times a day (QID) | ORAL | Status: DC | PRN
  Administered 2016-02-05 – 2016-02-07 (×5): 650 mg via ORAL
  Filled 2016-02-05 (×5): qty 2

## 2016-02-05 MED ORDER — INSULIN ASPART 100 UNIT/ML ~~LOC~~ SOLN
0.0000 [IU] | SUBCUTANEOUS | Status: DC
  Administered 2016-02-05: 2 [IU] via SUBCUTANEOUS
  Administered 2016-02-05: 1 [IU] via SUBCUTANEOUS
  Administered 2016-02-05: 3 [IU] via SUBCUTANEOUS
  Administered 2016-02-05 – 2016-02-06 (×3): 1 [IU] via SUBCUTANEOUS
  Administered 2016-02-06: 3 [IU] via SUBCUTANEOUS
  Administered 2016-02-06 – 2016-02-07 (×5): 1 [IU] via SUBCUTANEOUS

## 2016-02-05 MED ORDER — ENSURE ENLIVE PO LIQD
237.0000 mL | Freq: Two times a day (BID) | ORAL | Status: DC
  Administered 2016-02-06: 237 mL via ORAL

## 2016-02-05 MED ORDER — CLOPIDOGREL BISULFATE 75 MG PO TABS
75.0000 mg | ORAL_TABLET | Freq: Every day | ORAL | Status: DC
  Administered 2016-02-06 – 2016-02-07 (×2): 75 mg via ORAL
  Filled 2016-02-05 (×2): qty 1

## 2016-02-05 MED ORDER — ORAL CARE MOUTH RINSE
15.0000 mL | Freq: Two times a day (BID) | OROMUCOSAL | Status: DC
  Administered 2016-02-06: 15 mL via OROMUCOSAL

## 2016-02-05 NOTE — Progress Notes (Addendum)
CRITICAL VALUE ALERT  Critical value received:  Lactic Acid 4.5  Date of notification:  02/05/16  Time of notification:  0148  Critical value read back:Yes.    Nurse who received alert:  Kennith Centerachel Saban Heinlen, RN  Lactic acid trending down since last(5.53). Will continue to monitor.  Peyton Bottomsachel R Lennell Shanks, RN 1:51 AM 02/05/16

## 2016-02-05 NOTE — Progress Notes (Signed)
Initial Nutrition Assessment  DOCUMENTATION CODES:   Not applicable  INTERVENTION:    Ensure Enlive po BID, each supplement provides 350 kcal and 20 grams of protein  NUTRITION DIAGNOSIS:   Increased nutrient needs related to chronic illness as evidenced by estimated needs  GOAL:   Patient will meet greater than or equal to 90% of their needs  MONITOR:   PO intake, Supplement acceptance, Labs, Weight trends, I & O's  REASON FOR ASSESSMENT:   Malnutrition Screening Tool  ASSESSMENT:   82 y.o. Male with PMH as outlined below and who [facility] ([facility] -  called daughter Judeth CornfieldStephanie but no answer, also called facility but no answer).  [facility] with SOB and tachypnea as well as neck pain.  Symptoms had apparently started after he woke up and were worse with movement.  RD unable to obtain nutrition hx >> pt undergoing Echo. [facility]. PO intake 100% per flowsheet records. Pt with hx of COPD and chronic bronchitis. Would benefit from oral nutrition supplements >> will order.  Unable to complete Nutrition Focused Physical Exam at this time.  Diet Order:  Diet heart healthy/carb modified Room service appropriate? Yes; Fluid consistency: Thin  Skin:  Reviewed, no issues  Last BM:  N/A  Height:   Ht Readings from Last 1 Encounters:  02/05/16 5\' 9"  (1.753 m)    Weight:   Wt Readings from Last 1 Encounters:  02/05/16 133 lb 6.1 oz (60.5 kg)    Ideal Body Weight:  73 kg  BMI:  Body mass index is 19.7 kg/m.  Estimated Nutritional Needs:   Kcal:  1800-2000  Protein:  90-100 gm  Fluid:  1.8-2.0 L  EDUCATION NEEDS:   No education needs identified at this time  Maureen ChattersKatie Timonthy Hovater, RD, LDN Pager #: [phone] After-Hours Pager #: [phone]

## 2016-02-05 NOTE — Progress Notes (Signed)
CRITICAL VALUE ALERT  Critical value received:  Troponin 4.95  Date of notification:  02/05/16  Time of notification:  0153  Critical value read back:Yes.    Nurse who received alert:  Kennith Centerachel Amariyon Maynes, RN  Troponin levels trending down, expected value. Will continue to monitor.  Peyton Bottomsachel R Emily Massar, RN 1:54 AM 02/05/16

## 2016-02-05 NOTE — Progress Notes (Signed)
RT called to put patient back on BiPAP, however, after pulling patient up in the bed he refused the BiPAP at this time and stated that helped his breathing. RN aware. Will continue to monitor.

## 2016-02-05 NOTE — Consult Note (Signed)
Cardiology Consult    Patient ID: Chase Benjamin MRN: [id number], DOB/AGE: 1931/08/03   Admit date: 01/26/2016 Date of Consult: 02/05/2016  Primary Physician: Florentina JennyRIPP, HENRY, MD Primary Cardiologist: none Requesting Provider: Celene Benjamin  Patient Profile    Chase Benjamin is a 82 y o man with NSTEMI     Past Medical History   Past Medical History:  Diagnosis Date  . Asthma   . BPH (benign prostatic hyperplasia)    faxed over [facility]  . COPD (chronic obstructive pulmonary disease) (HCC)   . Depression   . Dyslipidemia   . GERD (gastroesophageal reflux disease)   . Headache(784.0)   . History of psychosis LAST DOCMENTATION ADMISSION  FOR PSYCHOSIS/ DEPRESSION  WITH SCHIZOPHRENIA   02-25-2010  . Iron deficiency anemia   . Muscle weakness    from [facility] 01/11/2016  . OA (osteoarthritis)   . Penile pain SECONDARY TO SEVERE PHIMOSIS  . Personal history of schizophrenia   . Phimosis SEVERE  . Urinary incontinence    from [facility] 02/03/2016  . Wheelchair dependent CAN PIVET W/ ASSIST    Past Surgical History:  Procedure Laterality Date  . CIRCUMCISION  06/01/2012   Procedure: CIRCUMCISION ADULT;  Surgeon: Lindaann SloughMarc-Henry Nesi, MD;  [facility];  Service: Urology;  Laterality: N/A;      . TOTAL HIP ARTHROPLASTY  11-15-2001  DR DALLDORF   RIGHT      Allergies    History of Present Illness    Chase Benjamin is a [age] y o man who lives in an assisted living facility and carries the diagnosis of schizophrenia, depression and COPD. He presented from his AL facility for SOB to [facility]. Found to be in respiratory distress and with ischemic ECG changes. Trop elevated to 5. HPI is limited since the patient is obtunded, now on BIPAP and no family members at bedside.   The ICU attending reports that there was no report of CP and chronicity of SOB is unclear. Pt is on asa otherwise no cardiac meds.   [facility] he is on Bipap for  hypoxia and AMS. His vitals were stable (with low normal BP).   Inpatient Medications    . aspirin  81 mg Oral Daily  . chlorhexidine  15 mL Mouth Rinse BID  . doxycycline (VIBRAMYCIN) IV  100 mg Intravenous Q12H  . insulin aspart  0-9 Units Subcutaneous Q4H  . ipratropium-albuterol  3 mL Nebulization Q6H  . mouth rinse  15 mL Mouth Rinse q12n4p    Family History    History reviewed. No pertinent family history.  Social History    Social History   Social History  . Marital status: Single    Spouse name: N/A  . Number of children: N/A  . Years of education: N/A   Occupational History  . Not on file.   Social History Main Topics  . Smoking status: Current Every Day Smoker    Packs/day: 0.25    Types: Cigarettes  . Smokeless tobacco: Never Used  . Alcohol use No  . Drug use: No  . Sexual activity: Not on file   Other Topics Concern  . Not on file   Social History Narrative  . No narrative on file     Review of Systems    General:  No chills, fever, night sweats or weight changes.  Cardiovascular:  No chest pain, dyspnea on exertion, edema, orthopnea, palpitations, paroxysmal  nocturnal dyspnea. Dermatological: No rash, lesions/masses Respiratory: No cough, dyspnea Urologic: No hematuria, dysuria Abdominal:   No nausea, vomiting, diarrhea, bright red blood per rectum, melena, or hematemesis Neurologic:  No visual changes, wkns, changes in mental status. All other systems reviewed and are otherwise negative except as noted above.  Physical Exam    Blood pressure 93/69, pulse 90, temperature 97.5 F (36.4 C), temperature source Axillary, resp. rate 12, height 5\' 9"  (1.753 m), weight 59.9 kg (132 lb 0.9 oz), SpO2 100 %.  General: on bipap , sleepy Psych: sleepy Neuro: cant eval HEENT: Normal  Neck: elevated JVP Lungs: on bipap, cant eval Heart: RRR no s3, s4, or murmurs. Abdomen: Soft, non-tender, non-distended, BS + x 4.  Extremities: No clubbing, cyanosis  or edema. DP/PT/Radials 2+ and equal bilaterally.  Labs    Troponin (Point of Care Test) No results for input(s): TROPIPOC in the last 72 hours.  Recent Labs  2016-02-27 1355 Feb 27, 2016 1644  TROPONINI 5.18* 5.79*   Lab Results  Component Value Date   WBC 10.1 02-27-2016   HGB 11.7 (L) 27-Feb-2016   HCT 34.3 (L) Feb 27, 2016   MCV 111.7 (H) 2016-02-27   PLT 201 02/27/16    Recent Labs Lab 02-27-2016 1355  NA 141  142  K 3.9  3.9  CL 111  112*  CO2 21*  21*  BUN 29*  30*  CREATININE 1.60*  1.70*  CALCIUM 8.7*  8.8*  PROT 7.1  BILITOT 1.0  ALKPHOS 47  ALT 23  AST 45*  GLUCOSE 166*  168*   Lab Results  Component Value Date   CHOL  02/25/2010    108        ATP III CLASSIFICATION:  <200     mg/dL   Desirable  742-595  mg/dL   Borderline High  >=638    mg/dL   High          HDL 39 (L) 02/25/2010   LDLCALC  02/25/2010    46        Total Cholesterol/HDL:CHD Risk Coronary Heart Disease Risk Table                     Men   Women  1/2 Average Risk   3.4   3.3  Average Risk       5.0   4.4  2 X Average Risk   9.6   7.1  3 X Average Risk  23.4   11.0        Use the calculated Patient Ratio above and the CHD Risk Table to determine the patient's CHD Risk.        ATP III CLASSIFICATION (LDL):  <100     mg/dL   Optimal  756-433  mg/dL   Near or Above                    Optimal  130-159  mg/dL   Borderline  295-188  mg/dL   High  >416     mg/dL   Very High   TRIG 606 02/25/2010   No results found for: [facility]   Radiology Studies    ECG & Cardiac Imaging    ST depressions in infero lateral leads. NSR  CXR with pulm edema, elevated right hemi-diaphragm  Assessment & Plan    Chase Benjamin has a PMH of numerous psychiatric diseases and COPD. He presents what seems to be acute? Onset of SOB. Found to be hypoxic  and have an NSTEMI. He is HDS, on Bipap. Likely with some pulm edema. BNP elevated. CXR with pulm edema.  Primary started on Heparin, ASA.    Recommendations:  - cont on Heparin drip and asa 81 mg qday - start on high potency statin and hold of BB or ACEI - He will need evaluation for LHC in the morning. Before we proceed with it we need to get in touch with family to discuss goals of care and get consent.   Signed, Marisue Canion,  02/05/2016, 12:44 AM

## 2016-02-05 NOTE — Procedures (Deleted)
Bronchoscopy  for Percutaneous  Tracheostomy  Name: Chase Benjamin MRN: [id number] DOB: 08-Dec-1931 Procedure: Bronchoscopy for Percutaneous Tracheostomy Indications: Diagnostic evaluation of the airways In conjunction with: Dr. Tyson AliasFeinstein   Procedure Details Consent: Risks of procedure as well as the alternatives and risks of each were explained to the (patient/caregiver).  Consent for procedure obtained. Time Out: Verified patient identification, verified procedure, site/side was marked, verified correct patient position, special equipment/implants available, medications/allergies/relevent history reviewed, required imaging and test results available.  Performed  In preparation for procedure, patient was given 100% FiO2 and bronchoscope lubricated. Sedation: Benzodiazepines and Muscle relaxants  Airway entered and the following bronchi were examined:carina   Procedures performed: Endotracheal Tube retracted in 2 cm increments. Cannulation of airway observed. Dilation observed. Placement of trachel tube  observed . No overt complications. Bronchoscope removed.    Evaluation Hemodynamic Status: BP stable throughout; O2 sats: stable throughout Patient's Current Condition: stable Specimens:  None Complications: No apparent complications Patient did tolerate procedure well.   Chase Benjamin ACNP [facility] Pager [phone] till 3 pm If no answer page [phone] 02/05/2016, 9:56 AM

## 2016-02-05 NOTE — Progress Notes (Signed)
Patient Name: Chase Benjamin Date of Encounter: 02/05/2016  Active Problems:   NSTEMI (non-ST elevated myocardial infarction) [facility])   Primary Cardiologist: New Patient Profile: 82 year old male with a past medical history of HLD, COPD, schizophrenia and depression. He presented from his assisted living facility with SOB, troponin elevated at 5.18>>5.79>>4.95>>6.17.   SUBJECTIVE: Feels ok, denies chest pain. Does feel slightly SOB.    OBJECTIVE Vitals:   02/05/16 0600 02/05/16 0700 02/05/16 0751 02/05/16 0800  BP: 93/64 102/68  97/67  Pulse: 88 88  (!) 102  Resp: 20 (!) 21  (!) 22  Temp:   98.4 F (36.9 C)   TempSrc:   Oral   SpO2: 100% 100% 99% 100%  Weight:      Height:        Intake/Output Summary (Last 24 hours) at 02/05/16 0851 Last data filed at 02/05/16 0800  Gross per 24 hour  Intake          1361.46 ml  Output                0 ml  Net          1361.46 ml   Filed Weights   01/16/2016 1522 01/21/2016 2200 02/05/16 0400  Weight: 140 lb (63.5 kg) 132 lb 0.9 oz (59.9 kg) 133 lb 6.1 oz (60.5 kg)    PHYSICAL EXAM General: Well developed, well nourished, male in no acute distress. Head: Normocephalic, atraumatic.  Neck: Supple without bruits, JVD. Lungs:  Resp regular and labored, crackles in bases.  Heart: RRR, S1, S2, no S3, S4, or murmur; no rub. Abdomen: Soft, non-tender, non-distended, BS + x 4.  Extremities: No clubbing, cyanosis, no peripheral edema.  Neuro: Alert and oriented X 3. Moves all extremities spontaneously. Psych: Normal affect.  LABS: CBC: Recent Labs  01/31/2016 1355 02/05/16 0357  WBC 10.1 16.7*  HGB 11.7* 11.7*  HCT 34.3* 35.4*  MCV 111.7* 113.5*  PLT 201 181   INR: Recent Labs  01/29/2016 1355  INR 1.11   Basic Metabolic Panel: Recent Labs  01/16/2016 1355 02/05/16 0047  NA 141  142 143  K 3.9  3.9 3.9  CL 111  112* 108  CO2 21*  21* 24  GLUCOSE 166*  168* 224*  BUN 29*  30* 31*  CREATININE 1.60*  1.70* 1.88*  CALCIUM  8.7*  8.8* 9.3  MG  --  2.4  PHOS  --  3.1   Liver Function Tests: Recent Labs  01/06/2016 1355  AST 45*  ALT 23  ALKPHOS 47  BILITOT 1.0  PROT 7.1  ALBUMIN 3.5   Cardiac Enzymes: Recent Labs  01/25/2016 1644 02/05/16 0039 02/05/16 0540  TROPONINI 5.79* 4.95* 6.17*   BNP:  B Natriuretic Peptide  Date/Time Value Ref Range Status  02/01/2016 01:55 PM 872.2 (H) 0.0 - 100.0 pg/mL Final   Fasting Lipid Panel: Recent Labs  02/05/16 0040  CHOL 104  HDL 53  LDLCALC 42  TRIG 46  CHOLHDL 2.0   Thyroid Function Tests: Recent Labs  02/05/16 0347  TSH 1.711    Current Facility-Administered Medications:  .  0.9 %  sodium chloride infusion, 10-20 mL/hr, Intravenous, Continuous, Ankit Nanavati, MD, Last Rate: 10 mL/hr at 02/05/16 0800, 10 mL/hr at 02/05/16 0800 .  0.9 %  sodium chloride infusion, , Intravenous, Continuous, Rahul P Desai, PA-C, Last Rate: 50 mL/hr at 02/05/16 0800 .  0.9 %  sodium chloride infusion, 250 mL, Intravenous, PRN,  Rahul P Desai, PA-C .  aspirin chewable tablet 81 mg, 81 mg, Oral, Daily, Rahul P Desai, PA-C .  chlorhexidine (PERIDEX) 0.12 % solution 15 mL, 15 mL, Mouth Rinse, BID, Roslynn AmbleJennings E Nestor, MD .  doxycycline (VIBRAMYCIN) 100 mg in dextrose 5 % 250 mL IVPB, 100 mg, Intravenous, Q12H, Ankit Nanavati, MD, 100 mg at 02/05/16 0655 .  heparin ADULT infusion 100 units/mL (25000 units/22250mL sodium chloride 0.45%), 750 Units/hr, Intravenous, Continuous, Anh P Pham, RPH, Last Rate: 7.5 mL/hr at 02/05/16 0800, 750 Units/hr at 02/05/16 0800 .  insulin aspart (novoLOG) injection 0-9 Units, 0-9 Units, Subcutaneous, Q4H, Roslynn AmbleJennings E Nestor, MD, 2 Units at 02/05/16 [phone] .  ipratropium-albuterol (DUONEB) 0.5-2.5 (3) MG/3ML nebulizer solution 3 mL, 3 mL, Nebulization, Q6H, Rahul P Desai, PA-C, 3 mL at 02/05/16 0750 .  MEDLINE mouth rinse, 15 mL, Mouth Rinse, q12n4p, Roslynn AmbleJennings E Nestor, MD .  MEDLINE mouth rinse, 15 mL, Mouth Rinse, BID, Roslynn AmbleJennings E Nestor, MD .  sodium chloride 10 mL/hr (02/05/16 0800)  . sodium chloride 50 mL/hr at 02/05/16 0800  . heparin 750 Units/hr (02/05/16 0800)    TELE:   NSR   ECG: ST depression in the inferior and lateral leads.   Radiology/Studies: Dg Chest Port 1 View  Result Date: 02/05/2016 CLINICAL DATA:  Respiratory failure EXAM: PORTABLE CHEST 1 VIEW COMPARISON:  February 04, 2016 FINDINGS: There has been interval clearing of interstitial edema bilaterally. A slight degree of edema remains in the left base. There is atelectatic change in the right mid and lower lung zones and left base regions. There is no new opacity. There is persistent elevation of the right hemidiaphragm. The heart is borderline prominent with pulmonary vascularity within normal limits. No atrophy. No bone lesions evident. IMPRESSION: There has been interval resolution of interstitial edema except for slight residual edema in the left base. Patchy atelectasis in lung bases persists. There is persistent elevation of the right hemidiaphragm. No new opacity. Stable cardiac silhouette. Electronically Signed   By: Bretta BangWilliam  Woodruff III M.D.   On: 02/05/2016 07:12   Dg Chest Port 1 View  Result Date: 01/29/2016 CLINICAL DATA:  Shortness of breath.  Tachypnea. EXAM: PORTABLE CHEST 1 VIEW COMPARISON:  02/05/2014 FINDINGS: Chronically elevated right hemidiaphragm with associated scarring at the right lung base and right mid lung. Tortuous thoracic aorta. Heart size within normal limits. There is some slight worsening of the perihilar density on the right compared to 02/05/14. Airway thickening is present, suggesting bronchitis or reactive airways disease. Interstitial accentuation increased from prior with some indistinctness of pulmonary vasculature. Kerley B-lines observed. Tapering of the peripheral pulmonary vasculature favors emphysema. IMPRESSION: 1. Interstitial accentuation with Kerley B-lines, typically associated with interstitial pulmonary edema, although  the heart contour is not enlarged. Possibility of atypical pneumonia is accordingly raised. 2. Perihilar and right basilar scarring, slightly increased in the perihilar region compared to the 2015 comparison exam. Chronically elevated right hemidiaphragm. 3. Emphysema. Electronically Signed   By: Gaylyn RongWalter  Liebkemann M.D.   On: 01/10/2016 14:21     Current Medications:  . aspirin  81 mg Oral Daily  . chlorhexidine  15 mL Mouth Rinse BID  . doxycycline (VIBRAMYCIN) IV  100 mg Intravenous Q12H  . insulin aspart  0-9 Units Subcutaneous Q4H  . ipratropium-albuterol  3 mL Nebulization Q6H  . mouth rinse  15 mL Mouth Rinse q12n4p  . mouth rinse  15 mL Mouth Rinse BID   . sodium chloride 10 mL/hr (02/05/16 0800)  .  sodium chloride 50 mL/hr at 02/05/16 0800  . heparin 750 Units/hr (02/05/16 0800)    ASSESSMENT AND PLAN: Active Problems:   NSTEMI (non-ST elevated myocardial infarction) (HCC)  1. NSTEMI: Denies chest pain, still having some dyspnea at rest. Troponin elevated at 6.17 this am and EKG concerning for ischemia with ST depression in inferior and lateral leads. Presented with pulmonary edema, required Bipap. He was not given Lasix due to hypotension. Remains hypotensive this am.   Will continue heparin gtt and ASA. He is not a cath candidate at this time due to his chronic renal disease and co morbidities. The patient does not wish to have a heart cath. Dr. Excell Seltzer to discuss with family the plan of care.   2. Pulmonary edema: BNP elevated, chest x ray consistent with CHF process. No echo to review, pending for today. He would benefit from diuresis, but limited by hypotension.   3. CKD stage III: GFR is 32, creatinine is slightly above baseline. Avoid nephrotoxic drugs.   Signed, Little Ishikawa , NP 8:51 AM 02/05/2016 Pager [phone]  Patient seen, examined. Available data reviewed. Agree with findings, assessment, and plan as outlined by Suzzette Righter, NP. Complex 82 year old gentleman  with schizophrenia who lives in an assisted living facility and is essentially wheelchair-bound. He presented yesterday with acute pulmonary edema and non-ST elevation infarction.  On exam, he has a flat affect but is completely alert and oriented with appropriate responses to questions. He is in no distress. JVP is normal, lung fields are clear, heart is regular rate and rhythm without murmur or gallop, abdomen is soft and nontender, there is no peripheral edema present, feet are warm and dry.  EKG shows sinus rhythm with marked ST/T-wave changes and sister with inferolateral/diffuse ischemia. Troponin elevation is significant.  The patient is at high risk of severe/multivessel coronary artery disease. He has poor functional capacity and multiple comorbid medical conditions with chronic kidney disease, advanced age, essentially nonambulatory status. I reviewed treatment strategies including conservative medical therapy for presumed CAD versus an invasive approach with cardiac catheterization and possible PCI. This patient would not be a candidate for cardiac surgery under any circumstances. He does not with to undergo any invasive evaluation and I agree that the most appropriate treatment course will be with conservative medical management. I have attempted to call his daughter to update her but I am not able to reach her this morning. I left my number in case she calls in to the nurses station. The patient in my opinion exhibits appropriate insight although somewhat limited.  Would continue IV heparin, monitor electrolytes, and await 2-D echocardiogram. Will start Plavix. Unable to add in beta blocker/ACE inhibitor/further diuretics because of hypotension. Repeat CXR/labs tomorrow am. Labs compared to previous and AKI appears relatively mild as baseline creatinine is about 1.4-15 mg/dL but will need to follow. Respiratory status improved now able to be off of BiPap. Hypoxemic on RA but O2 sats adequate on  O2 per [place].  The patient is critically ill with multiple organ systems dysfunction and requires high complexity decision making for assessment and support, frequent evaluation and titration of therapies, application of advanced monitoring technologies and extensive interpretation of multiple databases.   Critical Care Time devoted to patient care services described in this note is 35 minutes.  Tonny Bollman, M.D. 02/05/2016 10:39 AM

## 2016-02-05 NOTE — Progress Notes (Signed)
Phlebotomy drew AM labs and timed heparin, troponin, and lactic acid levels. MD Nestor at bedside. Cardiology fellow at bedside. Pt lethargic, but able to arouse and f/c. No apparent distress.  Will continue to monitor.  Peyton Bottomsachel R Taim Wurm, RN 12:45 AM 02/05/16

## 2016-02-05 NOTE — Progress Notes (Signed)
  Echocardiogram 2D Echocardiogram has been performed.  Janalyn HarderWest, Glorianna Gott R 02/05/2016, 11:19 AM

## 2016-02-05 NOTE — Care Management Note (Signed)
Case Management Note  Patient Details  Name: Chase Benjamin MRN: [id number] Date of Birth: Feb 23, 1932  Subjective/Objective:  [facility] and plans to return upon discharge.  CSW notified and will follow.                          Expected Discharge Plan:  Assisted Living / Rest Home  In-House Referral:  Clinical Social Work  Discharge planning Services  CM Consult  Status of Service:  In process, will continue to follow  Magdalene RiverMayo, Dominique Ressel T, RN 02/05/2016, 10:12 AM

## 2016-02-05 NOTE — Progress Notes (Signed)
CRITICAL VALUE ALERT  Critical value received:  Lactic Acid 3.5  Date of notification:  02/05/16  Time of notification:  0436  Critical value read back:Yes.    Nurse who received alert:  Kennith Centerachel Deondrea Markos, RN  Lactic Acid trending down, expected value. Will continue to monitor.   Peyton Bottomsachel R Briahna Pescador, RN 4:37 AM 02/05/16

## 2016-02-05 NOTE — Progress Notes (Signed)
ANTICOAGULATION CONSULT NOTE - Follow Up Consult  Pharmacy Consult for Heparin  Indication: chest pain/ACS  Patient Measurements: Height: 5\' 9"  (175.3 cm) Weight: 133 lb 6.1 oz (60.5 kg) IBW/kg (Calculated) : 70.7  Vital Signs: Temp: 98.4 F (36.9 C) (09/01 0751) Temp Source: Oral (09/01 0751) BP: 97/67 (09/01 0800) Pulse Rate: 102 (09/01 0800)  Labs:  Recent Labs  July 04, 2015 1355 July 04, 2015 1644 02/05/16 0039 02/05/16 0047 02/05/16 0357 02/05/16 0540 02/05/16 0954  HGB 11.7*  --   --   --  11.7*  --   --   HCT 34.3*  --   --   --  35.4*  --   --   PLT 201  --   --   --  181  --   --   APTT 32  --   --   --   --   --   --   LABPROT 14.3  --   --   --   --   --   --   INR 1.11  --   --   --   --   --   --   HEPARINUNFRC  --   --  0.48  --   --   --  0.69  CREATININE 1.60*  1.70*  --   --  1.88*  --   --   --   TROPONINI 5.18* 5.79* 4.95*  --   --  6.17*  --     Estimated Creatinine Clearance: 26.4 mL/min (by C-G formula based on SCr of 1.88 mg/dL).   Assessment: Heparin for elevated troponin, tx from [facility] Heparin level therapeutic x 2  Goal of Therapy:  Heparin level 0.3-0.7 units/ml Monitor platelets by anticoagulation protocol: Yes   Plan:  -Cont heparin at 750 units/hr -Continue to follow  Thank you Okey RegalLisa Dalonte Hardage, PharmD [phone] 02/05/2016,10:57 AM

## 2016-02-05 NOTE — Progress Notes (Signed)
ANTICOAGULATION CONSULT NOTE - Follow Up Consult  Pharmacy Consult for Heparin  Indication: chest pain/ACS  Patient Measurements: Height: 5\' 9"  (175.3 cm) Weight: 132 lb 0.9 oz (59.9 kg) IBW/kg (Calculated) : 70.7  Vital Signs: Temp: 97.5 F (36.4 C) (08/31 2300) Temp Source: Axillary (08/31 2300) BP: 93/69 (09/01 0000) Pulse Rate: 90 (09/01 0000)  Labs:  Recent Labs  01/29/2016 1355 01/28/2016 1644 02/05/16 0039  HGB 11.7*  --   --   HCT 34.3*  --   --   PLT 201  --   --   APTT 32  --   --   LABPROT 14.3  --   --   INR 1.11  --   --   HEPARINUNFRC  --   --  0.48  CREATININE 1.60*  1.70*  --   --   TROPONINI 5.18* 5.79*  --     Estimated Creatinine Clearance: 28.9 mL/min (by C-G formula based on SCr of 1.7 mg/dL).   Assessment: Heparin for elevated troponin, tx from [facility], initial heparin level is good at 0.48  Goal of Therapy:  Heparin level 0.3-0.7 units/ml Monitor platelets by anticoagulation protocol: Yes   Plan:  -Cont heparin at 750 units/hr -1000 HL  Chase Benjamin 02/05/2016,1:20 AM

## 2016-02-05 NOTE — H&P (Signed)
PULMONARY / CRITICAL CARE MEDICINE   Name: Chase Benjamin MRN: [id number] DOB: 09/05/1931    ADMISSION DATE:  01/18/2016 CONSULTATION DATE:  01/23/2016  REFERRING MD:  Rhunette Croft - EDP  CHIEF COMPLAINT:  SOB   BRIEF Chase Benjamin is a 82 y.o. male with PMH as outlined below and who resides at [facility] (unclear reason why he is at [facility] -  called daughter Chase Benjamin but no answer, also called facility but no answer).  He was brought to [facility] ED 8/31 with SOB and tachypnea as well as neck pain.  Symptoms had apparently started after he woke up and were worse with movement.  In ED, he had tachypnea and mild increase in WOB so was started on BiPAP.  CXR revealed pulmonary edema.  Trop elevated at 5 and EKG with ST depressions.  Due to being on BiPAP, PCCM was asked to admit pt for NSTEMI.     STUDIES:  CXR 8/31 > pulmonary edema.  CULTURES: None.  ANTIBIOTICS: None.  SIGNIFICANT EVENTS: 8/31 > admitted with NSTEMI.  LINES/TUBES: PIVx2   EVENT 8/31 - On BiPAP, opens eyes but does not answer questions.    SUBJECTIVE/OVERNIGHT/INTERVAL HX 02/05/16 - off bipap this AM. RN denies any issues. On [place]. Cards opted for med mgmt in best interest of patient with renal diseze and age and per patient goals. Lactic acidosis slowly better. CXR better  VITAL SIGNS: BP 97/67 (BP Location: Left Arm)   Pulse (!) 102   Temp 98.4 F (36.9 C) (Oral)   Resp (!) 22   Ht 5\' 9"  (1.753 m)   Wt 60.5 kg (133 lb 6.1 oz)   SpO2 100%   BMI 19.70 kg/m   HEMODYNAMICS:    VENTILATOR SETTINGS: Vent Mode: BIPAP FiO2 (%):  [40 %-96 %] 40 % Set Rate:  [12 bmp] 12 bmp PEEP:  [5 cmH20] 5 cmH20  INTAKE / OUTPUT: I/O last 3 completed shifts: In: 1226.5 [I.V.:976.5; IV Piggyback:250] Out: -    PHYSICAL EXAMINATION: General: Elderly male, in NAD. Neuro: Opens eyes to voice. Alert and interactive HEENT: [place]/AT. PERRL, sclerae anicteric.  BiPAP in place. Cardiovascular: Tachy, regular, no M/R/G.   Lungs: Respirations even and unlabored.  Faint crackles in base but no wheeze Abdomen: BS x 4, soft, NT/ND.  Musculoskeletal: No gross deformities, no edema.  Skin: Intact, warm, no rashes.  LABS:  PULMONARY  Recent Labs Lab 01/22/2016 1728  PHART 7.221*  PCO2ART 40.9  PO2ART 72.8*  HCO3 16.2*  O2SAT 89.8    CBC  Recent Labs Lab 01/26/2016 1355 02/05/16 0357  HGB 11.7* 11.7*  HCT 34.3* 35.4*  WBC 10.1 16.7*  PLT 201 181    COAGULATION  Recent Labs Lab 01/18/2016 1355  INR 1.11    CARDIAC   Recent Labs Lab 01/18/2016 1355 01/25/2016 1644 02/05/16 0039 02/05/16 0540  TROPONINI 5.18* 5.79* 4.95* 6.17*   No results for input(s): PROBNP in the last 168 hours.   CHEMISTRY  Recent Labs Lab 01/16/2016 1355 02/05/16 0047  NA 141  142 143  K 3.9  3.9 3.9  CL 111  112* 108  CO2 21*  21* 24  GLUCOSE 166*  168* 224*  BUN 29*  30* 31*  CREATININE 1.60*  1.70* 1.88*  CALCIUM 8.7*  8.8* 9.3  MG  --  2.4  PHOS  --  3.1   Estimated Creatinine Clearance: 26.4 mL/min (by C-G formula based on SCr of 1.88 mg/dL).   LIVER  Recent Labs  Lab 08-05-2015 1355  AST 45*  ALT 23  ALKPHOS 47  BILITOT 1.0  PROT 7.1  ALBUMIN 3.5  INR 1.11     INFECTIOUS  Recent Labs Lab 08-05-2015 1901 02/05/16 0039 02/05/16 0347  LATICACIDVEN 5.53* 4.5* 3.5*     ENDOCRINE CBG (last 3)   Recent Labs  02/05/16 0103 02/05/16 0402 02/05/16 0756  GLUCAP 220* 174* 147*         IMAGING x48h  - image(s) personally visualized  -   highlighted in bold Dg Chest Port 1 View  Result Date: 02/05/2016 CLINICAL DATA:  Respiratory failure EXAM: PORTABLE CHEST 1 VIEW COMPARISON:  February 04, 2016 FINDINGS: There has been interval clearing of interstitial edema bilaterally. A slight degree of edema remains in the left base. There is atelectatic change in the right mid and lower lung zones and left base regions. There is no new opacity. There is persistent elevation of the  right hemidiaphragm. The heart is borderline prominent with pulmonary vascularity within normal limits. No atrophy. No bone lesions evident. IMPRESSION: There has been interval resolution of interstitial edema except for slight residual edema in the left base. Patchy atelectasis in lung bases persists. There is persistent elevation of the right hemidiaphragm. No new opacity. Stable cardiac silhouette. Electronically Signed   By: Bretta BangWilliam  Woodruff III M.D.   On: 02/05/2016 07:12   Dg Chest Port 1 View  Result Date: 2015/08/27 CLINICAL DATA:  Shortness of breath.  Tachypnea. EXAM: PORTABLE CHEST 1 VIEW COMPARISON:  02/05/2014 FINDINGS: Chronically elevated right hemidiaphragm with associated scarring at the right lung base and right mid lung. Tortuous thoracic aorta. Heart size within normal limits. There is some slight worsening of the perihilar density on the right compared to 02/05/14. Airway thickening is present, suggesting bronchitis or reactive airways disease. Interstitial accentuation increased from prior with some indistinctness of pulmonary vasculature. Kerley B-lines observed. Tapering of the peripheral pulmonary vasculature favors emphysema. IMPRESSION: 1. Interstitial accentuation with Kerley B-lines, typically associated with interstitial pulmonary edema, although the heart contour is not enlarged. Possibility of atypical pneumonia is accordingly raised. 2. Perihilar and right basilar scarring, slightly increased in the perihilar region compared to the 2015 comparison exam. Chronically elevated right hemidiaphragm. 3. Emphysema. Electronically Signed   By: Gaylyn RongWalter  Liebkemann M.D.   On: 02017/03/23 14:21    DISCUSSION: 82 y.o. male from SNF, admitted 8/31 with NSTEMI.  Started on BiPAP for pulmonary edema and mildly increased WOB.  ASSESSMENT / PLAN:  CARDIOVASCULAR A:  NSTEMI. Hx HLD.  -bp soft but adquate. Cards doing med mgmt only  P:  Mgmt per cards  PULMONARY A: Pulmonary  edema. Dyspnea with mildly increased WOB. Hx COPD per report - no PFT's in system.   - improve WOB and pulm edema. Now on 2L [place] P:   Change bipap to qhs + daytime prn. DuoNebs q6hr in lieu of outpatient spiriva.   RENAL A:   Acute on Chronic Renal Failure - Last Creatinine 1.4 on 07/15/15. NAGMA - due to hyperventilation. Hypocalcemia.   - persists with lactic  Acidosis; but lactate better  P:   BMP in AM.with mag phos and lactiate  GASTROINTESTINAL A:   GERD. Nutrition. P:   Hold Protonix Advance to heart healthy diet  HEMATOLOGIC A:   VTE Prophylaxis. P:  SCD's / Heparin gtt. CBC in AM.  INFECTIOUS A:   Possible Acute COPD Exac at admit 2015/08/27 - no evidence 02/05/16 P:   Dc Empiric Doxycycline  ENDOCRINE A:   Hyperglycemia - No h/o DM. P:   Checking Hgb A1c SSI per Sensitive Algorithm Accu-Checks q4hr  NEUROLOGIC A:   Acute encephalopathy - unknown baseline. Hx schizophrenia, depression.  - calm on examm 02/05/16  P:   Monitor clinically. Avoid sedating meds. Hold preadmission citalopram, cyclobenzaprine, norco, olanzapine.  Family updated: at admit pcc 02-17-16 - Attempted to call daughter George Hugh; however, no answer.  Also attempted to call pt's SNF [facility]) but also no answer.  Will list as full code until contact can be made with facility or family to inquire on whether or not pt had any advanced directives.  No family at bedside 02/05/16 am  Interdisciplinary Family Meeting v Palliative Care Meeting:  Due by: 02/10/16.    GLOBAL Move to sdu. PCCM off and TRH primary from 02/06/16 -     Dr. Kalman Shan, M.D., [facility].C.P Pulmonary and Critical Care Medicine Staff Physician [facility] Pulmonary and Critical Care Pager: [phone], If no answer or between  15:00h - 7:00h: call [phone]  02/05/2016 10:44 AM

## 2016-02-05 NOTE — Progress Notes (Signed)
CRITICAL VALUE ALERT  Critical value received:  Lactic 2.2  Date of notification:  02/05/16  Time of notification:  0955  Critical value read back: yes  Nurse who received alert:  Andreas Bloweraroline Fowler RN   Critical value expected value is trending down. Will continue to monitor.

## 2016-02-05 DEATH — deceased

## 2016-02-06 ENCOUNTER — Inpatient Hospital Stay (HOSPITAL_COMMUNITY): Payer: Medicare Other

## 2016-02-06 DIAGNOSIS — I255 Ischemic cardiomyopathy: Secondary | ICD-10-CM

## 2016-02-06 DIAGNOSIS — N289 Disorder of kidney and ureter, unspecified: Secondary | ICD-10-CM

## 2016-02-06 LAB — GLUCOSE, CAPILLARY
GLUCOSE-CAPILLARY: 121 mg/dL — AB (ref 65–99)
GLUCOSE-CAPILLARY: 132 mg/dL — AB (ref 65–99)
GLUCOSE-CAPILLARY: 144 mg/dL — AB (ref 65–99)
GLUCOSE-CAPILLARY: 201 mg/dL — AB (ref 65–99)
GLUCOSE-CAPILLARY: 68 mg/dL (ref 65–99)
Glucose-Capillary: 135 mg/dL — ABNORMAL HIGH (ref 65–99)
Glucose-Capillary: 145 mg/dL — ABNORMAL HIGH (ref 65–99)

## 2016-02-06 LAB — BLOOD GAS, ARTERIAL
ACID-BASE DEFICIT: 11.9 mmol/L — AB (ref 0.0–2.0)
Acid-base deficit: 10 mmol/L — ABNORMAL HIGH (ref 0.0–2.0)
Bicarbonate: 12.7 mmol/L — ABNORMAL LOW (ref 20.0–28.0)
Bicarbonate: 14.1 mmol/L — ABNORMAL LOW (ref 20.0–28.0)
DELIVERY SYSTEMS: POSITIVE
DRAWN BY: 129711
Drawn by: 44135
EXPIRATORY PAP: 8
EXPIRATORY PAP: 6
FIO2: 40
FIO2: 0.4
INSPIRATORY PAP: 12
Inspiratory PAP: 16
LHR: 10 {breaths}/min
MODE: POSITIVE
O2 SAT: 97 %
O2 Saturation: 97.2 %
PCO2 ART: 24.2 mmHg — AB (ref 32.0–48.0)
PH ART: 7.342 — AB (ref 7.350–7.450)
Patient temperature: 98.6
Patient temperature: 98.6
pCO2 arterial: 24.1 mmHg — ABNORMAL LOW (ref 32.0–48.0)
pH, Arterial: 7.383 (ref 7.350–7.450)
pO2, Arterial: 110 mmHg — ABNORMAL HIGH (ref 83.0–108.0)
pO2, Arterial: 106 mmHg (ref 83.0–108.0)

## 2016-02-06 LAB — BASIC METABOLIC PANEL
ANION GAP: 15 (ref 5–15)
BUN: 75 mg/dL — ABNORMAL HIGH (ref 6–20)
CHLORIDE: 111 mmol/L (ref 101–111)
CO2: 15 mmol/L — ABNORMAL LOW (ref 22–32)
CREATININE: 2.05 mg/dL — AB (ref 0.61–1.24)
Calcium: 9 mg/dL (ref 8.9–10.3)
GFR calc non Af Amer: 29 mL/min — ABNORMAL LOW (ref >60)
GFR, EST AFRICAN AMERICAN: 33 mL/min — AB (ref >60)
Glucose, Bld: 95 mg/dL (ref 65–99)
Potassium: 4.9 mmol/L (ref 3.5–5.1)
SODIUM: 141 mmol/L (ref 135–145)

## 2016-02-06 LAB — POCT I-STAT 3, ART BLOOD GAS (G3+)
ACID-BASE DEFICIT: 13 mmol/L — AB (ref 0.0–2.0)
BICARBONATE: 12.4 mmol/L — AB (ref 20.0–28.0)
O2 Saturation: 29 %
TCO2: 13 mmol/L (ref 0–100)
pCO2 arterial: 27.1 mmHg — ABNORMAL LOW (ref 32.0–48.0)
pH, Arterial: 7.27 — ABNORMAL LOW (ref 7.350–7.450)
pO2, Arterial: 21 mmHg — CL (ref 83.0–108.0)

## 2016-02-06 LAB — MAGNESIUM: Magnesium: 2.3 mg/dL (ref 1.7–2.4)

## 2016-02-06 LAB — LACTIC ACID, PLASMA
LACTIC ACID, VENOUS: 4.6 mmol/L — AB (ref 0.5–1.9)
LACTIC ACID, VENOUS: 4.4 mmol/L — AB (ref 0.5–1.9)
Lactic Acid, Venous: 5.6 mmol/L (ref 0.5–1.9)

## 2016-02-06 LAB — CBC
HEMATOCRIT: 31.5 % — AB (ref 39.0–52.0)
HEMOGLOBIN: 10.3 g/dL — AB (ref 13.0–17.0)
MCH: 37.3 pg — ABNORMAL HIGH (ref 26.0–34.0)
MCHC: 32.7 g/dL (ref 30.0–36.0)
MCV: 114.1 fL — AB (ref 78.0–100.0)
PLATELETS: 198 10*3/uL (ref 150–400)
RBC: 2.76 MIL/uL — AB (ref 4.22–5.81)
RDW: 13.6 % (ref 11.5–15.5)
WBC: 19.7 10*3/uL — AB (ref 4.0–10.5)

## 2016-02-06 LAB — HEPARIN LEVEL (UNFRACTIONATED): Heparin Unfractionated: 0.63 IU/mL (ref 0.30–0.70)

## 2016-02-06 LAB — TROPONIN I: TROPONIN I: 5.92 ng/mL — AB (ref <0.03)

## 2016-02-06 LAB — HEMOGLOBIN A1C
Hgb A1c MFr Bld: 5.4 % (ref 4.8–5.6)
Mean Plasma Glucose: 108 mg/dL

## 2016-02-06 LAB — PHOSPHORUS: Phosphorus: 3.9 mg/dL (ref 2.5–4.6)

## 2016-02-06 MED ORDER — VANCOMYCIN HCL IN DEXTROSE 750-5 MG/150ML-% IV SOLN
750.0000 mg | INTRAVENOUS | Status: DC
  Administered 2016-02-06 – 2016-02-07 (×2): 750 mg via INTRAVENOUS
  Filled 2016-02-06 (×3): qty 150

## 2016-02-06 MED ORDER — ATORVASTATIN CALCIUM 10 MG PO TABS
10.0000 mg | ORAL_TABLET | Freq: Every day | ORAL | Status: DC
  Administered 2016-02-06 – 2016-02-07 (×2): 10 mg via ORAL
  Filled 2016-02-06 (×3): qty 1

## 2016-02-06 MED ORDER — SODIUM BICARBONATE 8.4 % IV SOLN
INTRAVENOUS | Status: DC
  Administered 2016-02-06 – 2016-02-07 (×2): via INTRAVENOUS
  Filled 2016-02-06 (×2): qty 150

## 2016-02-06 MED ORDER — SODIUM CHLORIDE 0.9 % IV SOLN: 250.0000 mL | INTRAVENOUS | Status: DC | PRN

## 2016-02-06 MED ORDER — DEXTROSE 50 % IV SOLN
INTRAVENOUS | Status: AC
  Administered 2016-02-06: 50 mL
  Filled 2016-02-06: qty 50

## 2016-02-06 MED ORDER — DEXTROSE 5 % IV SOLN
2.0000 g | INTRAVENOUS | Status: DC
  Administered 2016-02-06 – 2016-02-07 (×2): 2 g via INTRAVENOUS
  Filled 2016-02-06 (×3): qty 2

## 2016-02-06 NOTE — Progress Notes (Signed)
Patient use of accessory muscles when breathing more prominent than earlier this shift.  Respirations upper 30's-40.  Patient is maintaining saturations greater than 90% on 3L nasal cannula.  RN spoke with E-Link MD to update about current respiratory status and patient refusing to wear BIPAP even after RN education.  Per MD call back if patient's oxygen saturation dropped.

## 2016-02-06 NOTE — Progress Notes (Signed)
Hypoglycemic Event  CBG: 68 at 0410  Treatment: D50 IV 25 mL   Symptoms: Sweaty  Follow-up CBG: [id number] CBG Result:135  Possible Reasons for Event: Unknown  Comments/MD notified: Dr. Darrick Pennaeterding on call for Christena DeemElink    Lucrecia Mcphearson, [place] A

## 2016-02-06 NOTE — Progress Notes (Signed)
See H&P dated 02/05/16 for full detials, but essentially, [name] is an 82 y/o man who was admitted yesterday with worsening dyspnea, thought to be due to a COPD exacerbation. On the night of 9/1, PCCM was called to evaluate the patient for decreased mental status and increased WOB.   A/P: new hypotension, rising WBC, worsening tachycardia. Suggests some sort of sepitic profess. Will start broad coverage with vanc / cefepime. May need CVC, but ist doing fairly well long in the hospital. He may requiring intubation, but will hold off at this point.  Jamie KatoAaron Garrison Michie, MD Pulmonary & Critical Care Medicine February 06, 2016, 5:16 AM

## 2016-02-06 NOTE — Progress Notes (Signed)
PULMONARY / CRITICAL CARE MEDICINE   Name: Chase Benjamin MRN: [id number] DOB: 09-01-1931    ADMISSION DATE:  02/21/2016 CONSULTATION DATE:  2016/02/21  REFERRING MD:  Rhunette Croft - EDP  CHIEF COMPLAINT:  SOB   BRIEF Chase Benjamin is a 82 y.o. male with PMH as outlined below and who resides at [facility] (unclear reason why he is at [facility] -  called daughter Judeth Cornfield but no answer, also called facility but no answer).  He was brought to [facility] ED 8/31 with SOB and tachypnea as well as neck pain.  Symptoms had apparently started after he woke up and were worse with movement.  In ED, he had tachypnea and mild increase in WOB so was started on BiPAP.  CXR revealed pulmonary edema.  Trop elevated at 5 and EKG with ST depressions. Admitted with NSTEMI and resp failure requiring Bipap. He was transferred back to ICU last night for worsening mental status after refusing bipap. He improved after being placed on Bipap and did not require intubation.   STUDIES:  CXR 9/2 > Bibasilar atelectasis, no edema.  CULTURES: None.  ANTIBIOTICS: Cefepime 9/2 > Vanco 9/2>  SIGNIFICANT EVENTS: 8/31 > admitted with NSTEMI.  LINES/TUBES: PIVx2   EVENT   SUBJECTIVE/OVERNIGHT/INTERVAL HX 02/05/16 - off bipap this AM. RN denies any issues. On [place]. Cards opted for med mgmt in best interest of patient with renal diseze and age and per patient goals. Lactic acidosis slowly better. CXR better 9/2- transferred back to ICU for altered mental status, LA high again. Started on antibiotics  VITAL SIGNS: BP 91/70   Pulse (!) 118   Temp 97.3 F (36.3 C) (Oral)   Resp 20   Ht 5\' 9"  (1.753 m)   Wt 138 lb (62.6 kg)   SpO2 96%   BMI 20.38 kg/m   HEMODYNAMICS:    VENTILATOR SETTINGS: FiO2 (%):  [40 %] 40 %  INTAKE / OUTPUT: I/O last 3 completed shifts: In: 3136.5 [P.O.:445; I.V.:2391.5; IV Piggyback:300] Out: 210 [Urine:210]   PHYSICAL EXAMINATION: General: Elderly male, No distress, Off bipap Neuro: Opens  eyes to voice. No focal deficits HEENT: [place]/AT. PERRL, sclerae anicteric.  Cardiovascular: RRR, no M/R/G.  Lungs: Respirations even and unlabored.  No wheeze or crackles Abdomen: BS x 4, soft, NT/ND.  Musculoskeletal: No gross deformities, no edema.  Skin: Intact, warm, no rashes.  LABS:  PULMONARY  Recent Labs Lab February 21, 2016 1728 02/06/16 0605 02/06/16 0610  PHART 7.221* 7.270* 7.342*  PCO2ART 40.9 27.1* 24.1*  PO2ART 72.8* 21.0* 110*  HCO3 16.2* 12.4* 12.7*  TCO2  --  13  --   O2SAT 89.8 29.0 97.0    CBC  Recent Labs Lab 02-21-2016 1355 02/05/16 0357 02/06/16 0319  HGB 11.7* 11.7* 10.3*  HCT 34.3* 35.4* 31.5*  WBC 10.1 16.7* 19.7*  PLT 201 181 198    COAGULATION  Recent Labs Lab 02/21/2016 1355  INR 1.11    CARDIAC    Recent Labs Lab 02/21/2016 1355 2016/02/21 1644 02/05/16 0039 02/05/16 0540 02/06/16 0319  TROPONINI 5.18* 5.79* 4.95* 6.17* 5.92*   No results for input(s): PROBNP in the last 168 hours.   CHEMISTRY  Recent Labs Lab 21-Feb-2016 1355 02/05/16 0047 02/06/16 0319  NA 141  142 143 141  K 3.9  3.9 3.9 4.9  CL 111  112* 108 111  CO2 21*  21* 24 15*  GLUCOSE 166*  168* 224* 95  BUN 29*  30* 31* 75*  CREATININE 1.60*  1.70*  1.88* 2.05*  CALCIUM 8.7*  8.8* 9.3 9.0  MG  --  2.4 2.3  PHOS  --  3.1 3.9   Estimated Creatinine Clearance: 25 mL/min (by C-G formula based on SCr of 2.05 mg/dL).   LIVER  Recent Labs Lab 2015/10/21 1355  AST 45*  ALT 23  ALKPHOS 47  BILITOT 1.0  PROT 7.1  ALBUMIN 3.5  INR 1.11     INFECTIOUS  Recent Labs Lab 02/05/16 0954 02/06/16 0319 02/06/16 0904  LATICACIDVEN 2.2* 5.6* 4.6*     ENDOCRINE CBG (last 3)   Recent Labs  02/06/16 0410 02/06/16 0434 02/06/16 0856  GLUCAP 68 135* 132*     IMAGING x48h  - image(s) personally visualized  -   highlighted in bold   DISCUSSION: 82 y.o. male from SNF, admitted 8/31 with NSTEMI.  Started on BiPAP for pulmonary edema and mildly  increased WOB. Now back to ICU with tachycardia, increasing WBC count and LA.  ASSESSMENT / PLAN:  CARDIOVASCULAR A:  NSTEMI. Medical management Hx HLD.  P:  Mgmt per cards Heparin drip  PULMONARY A: Pulmonary edema. Dyspnea with mildly increased WOB. Hx COPD per report - no PFT's in system.   - improve WOB and pulm edema. Now on 2L [place] P:   Continue Bipap as needed and during night. DuoNebs q6hr in lieu of outpatient spiriva.  RENAL A:   Acute on Chronic Renal Failure - Last Creatinine 1.4 on 07/15/15. NAGMA - due to hyperventilation. Hypocalcemia. P:   Follow urine output and lactic acid.   GASTROINTESTINAL A:   GERD. Nutrition. P:   Hold Protonix Advance to heart healthy diet  HEMATOLOGIC A:   VTE Prophylaxis. P:  SCD's / Heparin gtt. CBC in AM.  INFECTIOUS A:   Possible Acute COPD Exac at admit 29-Jun-2015 - no evidence 02/05/16 P:   Given doxy on admission Now on vanco, cefepime for presumed sepsis.  ENDOCRINE A:   Hyperglycemia - No h/o DM. P:   Checking Hgb A1c SSI per Sensitive Algorithm Accu-Checks q4hr  NEUROLOGIC A:   Acute encephalopathy - unknown baseline. Hx schizophrenia, depression. P:   Monitor clinically. Avoid sedating meds. Hold preadmission citalopram, cyclobenzaprine, norco, olanzapine.  Family updated: at admit pcc 29-Jun-2015 - Attempted to call daughter George HughStephanie Legrande; however, no answer.  Also attempted to call pt's SNF [facility]([place]) but also no answer.  Will list as full code until contact can be made with facility or family to inquire on whether or not pt had any advanced directives.  No family at bedside 9/1 and 9/2  Interdisciplinary Family Meeting v Palliative Care Meeting:  Due by: 02/10/16.  Chilton GreathousePraveen Dariusz Brase MD [facility] Pager [phone] If no answer or after 3pm call: [phone] 02/06/2016, 10:23 AM

## 2016-02-06 NOTE — Progress Notes (Signed)
RN into room about 0410 to check patient's blood sugar, blood sugar low and treated (see previous RN note for details).  RN stayed at bedside throughout this time.  RN noted patient respirations continued to be high 30's-40.  Patient continues to answer RN questions appropriately but lethargy noted.  RN asked patient if he was getting tired and patient replied "yes". RN stated to patient "that's because you are working so hard to breathe".  Patient stated "I know".  RN encouraged patient to wear BIPAP again.  Patient refused at first and RN continued to talk with patient about benefits of wearing the BIPAP and patient replied, "whatever you think is best I'll do".  Respiratory was notified and came to bedside to place patient back on BIPAP.  During this, lab called to notify RN patient's lactic acid was 5.6.  Elink MD called per RN and was updated of all information in this note.  Dr. Darrick Pennaeterding stated someone would be coming see patient.

## 2016-02-06 NOTE — Progress Notes (Signed)
Patient ready to go back on BIPAP, respiratory notified.

## 2016-02-06 NOTE — Progress Notes (Signed)
CRITICAL VALUE ALERT  Critical value received: LA 4.6  Date of notification:  02/06/16  Time of notification:  0900  Critical value read back:Yes.    Nurse who received alert:  Stefani DamaKristina Gram Siedlecki, RN  MD notified (1st page):  Mannam  Time of first page:  0900  MD notified (2nd page):  Time of second page:  Responding MD:  Mannam  Time MD responded:  0900

## 2016-02-06 NOTE — Progress Notes (Signed)
Patient requested something to drink at this time.  BIPAP removed and patient placed on 3L nasal cannula.  Cold water offered, patient accepted and stated he would like to stay off BIPAP for about an hour and if he changed his mind he would call for RN.  Patient oxygen saturation at this time 94% on 3L nasal cannula. Call light within reach.

## 2016-02-06 NOTE — Progress Notes (Signed)
Pharmacy Antibiotic Note  Chase Benjamin is a 82 y.o. male admitted on 01/15/2016 with shortness of breath.  Pharmacy has been consulted for Vancomycin dosing. WBC increasing. Scr with slow rise up to ~2. Afebrile.   Plan: -Vancomycin 750 mg IV q24h -Cefepime 2g IV q24h per MD -Trend WBC, temp, renal function  -Drug levels as indicated   Height: 5\' 9"  (175.3 cm) Weight: 138 lb (62.6 kg) IBW/kg (Calculated) : 70.7  Temp (24hrs), Avg:97.7 F (36.5 C), Min:96.3 F (35.7 C), Max:98.8 F (37.1 C)   Recent Labs Lab 01/29/2016 1355 01/22/2016 1901 02/05/16 0039 02/05/16 0047 02/05/16 0347 02/05/16 0357 02/05/16 0954 02/06/16 0319  WBC 10.1  --   --   --   --  16.7*  --  19.7*  CREATININE 1.60*  1.70*  --   --  1.88*  --   --   --  2.05*  LATICACIDVEN  --  5.53* 4.5*  --  3.5*  --  2.2* 5.6*    Estimated Creatinine Clearance: 25 mL/min (by C-G formula based on SCr of 2.05 mg/dL).    Allergies  Allergen Reactions  . Penicillins Rash     [facility] no other information available. Documented reports of tolerating ceftriaxone and ancef in the past.    Abran DukeLedford, Castella Lerner 02/06/2016 5:13 AM

## 2016-02-06 NOTE — Progress Notes (Signed)
ANTICOAGULATION CONSULT NOTE - Follow Up Consult  Pharmacy Consult for Heparin  Indication: chest pain/ACS  Patient Measurements: Height: 5\' 9"  (175.3 cm) Weight: 138 lb (62.6 kg) IBW/kg (Calculated) : 70.7  Vital Signs: Temp: 97.3 F (36.3 C) (09/02 0851) Temp Source: Oral (09/02 0851) BP: 91/70 (09/02 1000) Pulse Rate: 118 (09/02 0900)  Labs:  Recent Labs  03/14/2016 1355  02/05/16 0039 02/05/16 0047 02/05/16 0357 02/05/16 0540 02/05/16 0954 02/06/16 0319  HGB 11.7*  --   --   --  11.7*  --   --  10.3*  HCT 34.3*  --   --   --  35.4*  --   --  31.5*  PLT 201  --   --   --  181  --   --  198  APTT 32  --   --   --   --   --   --   --   LABPROT 14.3  --   --   --   --   --   --   --   INR 1.11  --   --   --   --   --   --   --   HEPARINUNFRC  --   --  0.48  --   --   --  0.69 0.63  CREATININE 1.60*  1.70*  --   --  1.88*  --   --   --  2.05*  TROPONINI 5.18*  < > 4.95*  --   --  6.17*  --  5.92*  < > = values in this interval not displayed.  Estimated Creatinine Clearance: 25 mL/min (by C-G formula based on SCr of 2.05 mg/dL).   Assessment: Chase Benjamin with elevated troponin, [facility] Decision made to medically manage- started on IV heparin.  Heparin level remains therapeutic this morning. Hgb 10.3, plts 198- overall stable, no bleeding noted. Troponins remain elevated.  Goal of Therapy:  Heparin level 0.3-0.7 units/ml Monitor platelets by anticoagulation protocol: Yes   Plan:  -Cont heparin at 750 units/hr -daily heparin level and CBC -follow for s/s bleeding and duration of heparin infusion  Rosalynn Sergent D. Chanya Chrisley, PharmD, BCPS Clinical Pharmacist Pager: [phone] 02/06/2016 11:17 AM

## 2016-02-06 NOTE — Progress Notes (Signed)
Patient called for RN, stated he wanted to be taken off the BIPAP.  RN provided education pertaining to the benefits of wearing the BIPAP and patient continued to refuse stating he wanted to go back to wearing the nasal cannula.  Patient back on 3L nasal canula sating 96%.

## 2016-02-06 NOTE — Progress Notes (Signed)
Primary cardiologist: Dr. Tonny Bollman  Seen for followup: NSTEMI  Subjective:    No complaints of chest pain. No breathlessness at rest.  Objective:   Temp:  [97.3 F (36.3 C)-98.8 F (37.1 C)] 97.3 F (36.3 C) (09/02 0851) Pulse Rate:  [62-119] 118 (09/02 0900) Resp:  [13-37] 26 (09/02 1100) BP: (87-104)/(58-78) 104/71 (09/02 1100) SpO2:  [89 %-100 %] 96 % (09/02 0900) FiO2 (%):  [40 %] 40 % (09/02 0819) Weight:  [138 lb (62.6 kg)] 138 lb (62.6 kg) (09/02 0421) Last BM Date: 02/05/16  Filed Weights   14-Feb-2016 2200 02/05/16 0400 02/06/16 0421  Weight: 132 lb 0.9 oz (59.9 kg) 133 lb 6.1 oz (60.5 kg) 138 lb (62.6 kg)    Intake/Output Summary (Last 24 hours) at 02/06/16 1128 Last data filed at 02/06/16 1100  Gross per 24 hour  Intake          1946.67 ml  Output              310 ml  Net          1636.67 ml    Telemetry: Sinus tachycardia.   Exam:  General: No distress. Flat affect.  Lungs: Few crackles in the bases.  Cardiac: RRR without gallop.  Abdomen: NABS.  Extremities: No pitting edema.  Lab Results:  Basic Metabolic Panel:  Recent Labs Lab 02-14-2016 1355 02/05/16 0047 02/06/16 0319  NA 141  142 143 141  K 3.9  3.9 3.9 4.9  CL 111  112* 108 111  CO2 21*  21* 24 15*  GLUCOSE 166*  168* 224* 95  BUN 29*  30* 31* 75*  CREATININE 1.60*  1.70* 1.88* 2.05*  CALCIUM 8.7*  8.8* 9.3 9.0  MG  --  2.4 2.3    Liver Function Tests:  Recent Labs Lab 14-Feb-2016 1355  AST 45*  ALT 23  ALKPHOS 47  BILITOT 1.0  PROT 7.1  ALBUMIN 3.5    CBC:  Recent Labs Lab February 14, 2016 1355 02/05/16 0357 02/06/16 0319  WBC 10.1 16.7* 19.7*  HGB 11.7* 11.7* 10.3*  HCT 34.3* 35.4* 31.5*  MCV 111.7* 113.5* 114.1*  PLT 201 181 198    Cardiac Enzymes:  Recent Labs Lab 02/05/16 0039 02/05/16 0540 02/06/16 0319  TROPONINI 4.95* 6.17* 5.92*    Echocardiogram 02/05/2016: Study Conclusions  - Left ventricle: Septal apica and posterior lateral  akinesis   diffuse hypokinesis mid and basal septum move best. The cavity   size was moderately dilated. The estimated ejection fraction was   25%. Doppler parameters are consistent with both elevated   ventricular end-diastolic filling pressure and elevated left   atrial filling pressure. - Mitral valve: There was moderate regurgitation. - Left atrium: The atrium was mildly dilated. - Atrial septum: No defect or patent foramen ovale was identified. - Tricuspid valve: There was moderate regurgitation. - Pulmonary arteries: PA peak pressure: 51 mm Hg (S).   Medications:   Scheduled Medications: . aspirin  81 mg Oral Daily  . ceFEPime (MAXIPIME) IV  2 g Intravenous Q24H  . chlorhexidine  15 mL Mouth Rinse BID  . clopidogrel  75 mg Oral Daily  . feeding supplement (ENSURE ENLIVE)  237 mL Oral BID BM  . insulin aspart  0-9 Units Subcutaneous Q4H  . ipratropium-albuterol  3 mL Nebulization Q6H  . mouth rinse  15 mL Mouth Rinse q12n4p  . mouth rinse  15 mL Mouth Rinse BID  . vancomycin  750 mg Intravenous  Q24H     Infusions: . sodium chloride Stopped (02/06/16 0600)  . heparin 750 Units/hr (02/06/16 0700)  .  sodium bicarbonate  infusion 1000 mL 50 mL/hr at 02/06/16 0901     PRN Medications:  sodium chloride, sodium chloride, acetaminophen   Assessment:   1. NSTEMI, peak troponin I 6.2. No active chest pain. Reviewed most recent cardiology note by Dr. Excell Seltzerooper. Plan is for conservative management with medical therapy. He is currently on aspirin, Plavix, and heparin. No beta blocker currently with relative hypotension, and no ACE inhibitor/ARB with progressive renal insufficiency.  2. Suspected ischemic cardiomyopathy with LVEF 25% by echocardiogram.  3. Moderate mitral regurgitation.  4. Schizophrenia.  5. Limited functional capacity, essentially wheelchair-bound, previously in an assisted-living facility.   Plan/Discussion:    Follow-up ECG in a.m. to assess  evolutionary changes of infarct. Unable to add beta blocker, ACE inhibitor, or ARB at this time due to relatively low blood pressure. Continue aspirin and Plavix, heparin infusion for around 72 hours. Add low dose statin (LDL 42). Overall poor prognosis in light of multiple comorbidities.   Jonelle SidleSamuel G. Mikiah Demond, M.D., F.A.C.C.

## 2016-02-06 NOTE — Progress Notes (Signed)
PROGRESS NOTE    Chase Benjamin  ZOX:[id number] DOB: 22-Jul-1931 DOA: 01/27/2016 PCP: Florentina Jenny, MD   Brief Narrative: Chase Benjamin is a 82 y.o. male with PMH as outlined below and who resides at [facility] (unclear reason why he is at [facility] -  called daughter Judeth Cornfield but no answer, also called facility but no answer).  [facility] was [facility] with SOB and tachypnea as well as neck pain.  Symptoms had apparently started after he woke up and were worse with movement. In ED, he had tachypnea and mild increase in WOB so was started on BiPAP.  CXR revealed pulmonary edema.  Trop elevated at 5 and EKG with ST depressions. Due to being on BiPAP, PCCM was asked to admit pt for NSTEMI.  Assessment & Plan:   Active Problems:   NSTEMI (non-ST elevated myocardial infarction) (HCC)   Acute on chronic respiratory failure (HCC)  1-Acute on chronic Respiratory failure;  Improved on BIPAP. Hx COPD per report - no PFT's in system. Chest x ray with atelectasis, increase WBC, cough, lactic acidosis. Will treat for PNA.  Unable to use lasix due to concern for sepsis and soft BP.  Nebulizer treatments every 6 hours.  CCM to follow today/   2-NSTEMI;  Troponin peak to 5.  ECHO Ef 25 %  On heparin Gtt.  Unable to use BB due to hypotension.  On plavix.  LDL 42. Will defer to cardiology start statin.   3-Metabolic acidosis;  Start IV bicarb gtt, low rate.   4- Sepsis; lactic acidosis, hypotension, leukocytosis.  Continue with vancomycin, cefepime.  CCM following.  Cycle lactic acid.   5-Acute encephalopathy - unknown baseline. Hx schizophrenia, depression. Alert this morning on BIPAP.   6-AKI on chronic kidney disease stage III; IV fluids, monitor for pulmonary edema.  strict I and O.   DVT prophylaxis: on heparin for ACS, NSTEMI Code Status: full code.  Family Communication: none at bedside.  Disposition Plan: remain in the ICU   Consultants:   CCM  Cardiology     Procedures: ECHO;  - Left ventricle: Septal apica and posterior lateral akinesis   diffuse hypokinesis mid and basal septum move best. The cavity   size was moderately dilated. The estimated ejection fraction was   25%. Doppler parameters are consistent with both elevated   ventricular end-diastolic filling pressure and elevated left   atrial filling pressure. - Mitral valve: There was moderate regurgitation. - Left atrium: The atrium was mildly dilated. - Atrial septum: No defect or patent foramen ovale was identified. - Tricuspid valve: There was moderate regurgitation.  - Pulmonary arteries: PA peak pressure: 51 mm Hg (S).   Antimicrobials:   Vancomycin 9-02  Cefepime 9-02   Subjective: He is on BIPAP.  He was transfer back to ICU due to AMS, respiratory distress, hypotension, back on BIPAP  He wants to drink cold water.  He denies abdominal pain and or diarrhea.   Objective: Vitals:   02/06/16 0700 02/06/16 0715 02/06/16 0819 02/06/16 0821  BP: 102/72 103/70    Pulse:      Resp: 20 (!) 21  18  Temp:      TempSrc:      SpO2:   98%   Weight:      Height:        Intake/Output Summary (Last 24 hours) at 02/06/16 0823 Last data filed at 02/06/16 0728  Gross per 24 hour  Intake  1875 ml  Output              210 ml  Net             1665 ml   Filed Weights   01/10/2016 2200 02/05/16 0400 02/06/16 0421  Weight: 59.9 kg (132 lb 0.9 oz) 60.5 kg (133 lb 6.1 oz) 62.6 kg (138 lb)    Examination:  General exam: Appears calm and comfortable on BIPAP Respiratory system: bilateral ronchus,  Respiratory effort normal. Cardiovascular system: S1 & S2 heard, RRR. No JVD, murmurs, rubs, gallops or clicks. No pedal edema. Gastrointestinal system: Abdomen is nondistended, soft and nontender. No organomegaly or masses felt. Normal bowel sounds heard. Central nervous system: Alert and oriented. No focal neurological deficits. Extremities: Symmetric 5 x 5  power. Skin: No rashes, lesions or ulcers    Data Reviewed: I have personally reviewed following labs and imaging studies  CBC:  Recent Labs Lab 01/25/2016 1355 02/05/16 0357 02/06/16 0319  WBC 10.1 16.7* 19.7*  HGB 11.7* 11.7* 10.3*  HCT 34.3* 35.4* 31.5*  MCV 111.7* 113.5* 114.1*  PLT 201 181 198   Basic Metabolic Panel:  Recent Labs Lab 01/25/2016 1355 02/05/16 0047 02/06/16 0319  NA 141  142 143 141  K 3.9  3.9 3.9 4.9  CL 111  112* 108 111  CO2 21*  21* 24 15*  GLUCOSE 166*  168* 224* 95  BUN 29*  30* 31* 75*  CREATININE 1.60*  1.70* 1.88* 2.05*  CALCIUM 8.7*  8.8* 9.3 9.0  MG  --  2.4 2.3  PHOS  --  3.1 3.9   GFR: Estimated Creatinine Clearance: 25 mL/min (by C-G formula based on SCr of 2.05 mg/dL). Liver Function Tests:  Recent Labs Lab 01/15/2016 1355  AST 45*  ALT 23  ALKPHOS 47  BILITOT 1.0  PROT 7.1  ALBUMIN 3.5   No results for input(s): LIPASE, AMYLASE in the last 168 hours. No results for input(s): AMMONIA in the last 168 hours. Coagulation Profile:  Recent Labs Lab 01/16/2016 1355  INR 1.11   Cardiac Enzymes:  Recent Labs Lab 01/20/2016 1355 01/22/2016 1644 02/05/16 0039 02/05/16 0540 02/06/16 0319  TROPONINI 5.18* 5.79* 4.95* 6.17* 5.92*   BNP (last 3 results) No results for input(s): PROBNP in the last 8760 hours. HbA1C:  Recent Labs  02/05/16 0039  HGBA1C 5.4   CBG:  Recent Labs Lab 02/05/16 1710 02/05/16 2021 02/06/16 0047 02/06/16 0410 02/06/16 0434  GLUCAP 122* 128* 121* 68 135*   Lipid Profile:  Recent Labs  02/05/16 0040  CHOL 104  HDL 53  LDLCALC 42  TRIG 46  CHOLHDL 2.0   Thyroid Function Tests:  Recent Labs  02/05/16 0347  TSH 1.711   Anemia Panel: No results for input(s): VITAMINB12, FOLATE, FERRITIN, TIBC, IRON, RETICCTPCT in the last 72 hours. Sepsis Labs:  Recent Labs Lab 02/05/16 0039 02/05/16 0347 02/05/16 0954 02/06/16 0319  LATICACIDVEN 4.5* 3.5* 2.2* 5.6*    Recent  Results (from the past 240 hour(s))  MRSA PCR Screening     Status: None   Collection Time: 01/06/2016 10:00 PM  Result Value Ref Range Status   MRSA by PCR NEGATIVE NEGATIVE Final    Comment:        The GeneXpert MRSA Assay (FDA approved for NASAL specimens only), is one component of a comprehensive MRSA colonization surveillance program. It is not intended to diagnose MRSA infection nor to guide or monitor treatment for MRSA infections.  Radiology Studies: Dg Chest Port 1 View  Result Date: 02/06/2016 CLINICAL DATA:  Sepsis. EXAM: PORTABLE CHEST 1 VIEW COMPARISON:  02/05/2016. FINDINGS: Stable mild cardiac enlargement and tortuous ectatic thoracic aorta. Stable eventration of the right hemidiaphragm with overlying subsegmental atelectasis. Stable left basilar atelectasis also. No pulmonary edema. Underlying emphysematous changes and chronic bronchitic changes. IMPRESSION: Persistent areas of bibasilar atelectasis. No edema or large effusion. Electronically Signed   By: Rudie MeyerP.  Gallerani M.D.   On: 02/06/2016 08:03   Dg Chest Port 1 View  Result Date: 02/05/2016 CLINICAL DATA:  Respiratory failure EXAM: PORTABLE CHEST 1 VIEW COMPARISON:  February 04, 2016 FINDINGS: There has been interval clearing of interstitial edema bilaterally. A slight degree of edema remains in the left base. There is atelectatic change in the right mid and lower lung zones and left base regions. There is no new opacity. There is persistent elevation of the right hemidiaphragm. The heart is borderline prominent with pulmonary vascularity within normal limits. No atrophy. No bone lesions evident. IMPRESSION: There has been interval resolution of interstitial edema except for slight residual edema in the left base. Patchy atelectasis in lung bases persists. There is persistent elevation of the right hemidiaphragm. No new opacity. Stable cardiac silhouette. Electronically Signed   By: Bretta BangWilliam  Woodruff III M.D.   On:  02/05/2016 07:12   Dg Chest Port 1 View  Result Date: 11-21-2015 CLINICAL DATA:  Shortness of breath.  Tachypnea. EXAM: PORTABLE CHEST 1 VIEW COMPARISON:  02/05/2014 FINDINGS: Chronically elevated right hemidiaphragm with associated scarring at the right lung base and right mid lung. Tortuous thoracic aorta. Heart size within normal limits. There is some slight worsening of the perihilar density on the right compared to 02/05/14. Airway thickening is present, suggesting bronchitis or reactive airways disease. Interstitial accentuation increased from prior with some indistinctness of pulmonary vasculature. Kerley B-lines observed. Tapering of the peripheral pulmonary vasculature favors emphysema. IMPRESSION: 1. Interstitial accentuation with Kerley B-lines, typically associated with interstitial pulmonary edema, although the heart contour is not enlarged. Possibility of atypical pneumonia is accordingly raised. 2. Perihilar and right basilar scarring, slightly increased in the perihilar region compared to the 2015 comparison exam. Chronically elevated right hemidiaphragm. 3. Emphysema. Electronically Signed   By: Gaylyn RongWalter  Liebkemann M.D.   On: 006-17-2017 14:21        Scheduled Meds: . aspirin  81 mg Oral Daily  . ceFEPime (MAXIPIME) IV  2 g Intravenous Q24H  . chlorhexidine  15 mL Mouth Rinse BID  . clopidogrel  75 mg Oral Daily  . feeding supplement (ENSURE ENLIVE)  237 mL Oral BID BM  . insulin aspart  0-9 Units Subcutaneous Q4H  . ipratropium-albuterol  3 mL Nebulization Q6H  . mouth rinse  15 mL Mouth Rinse q12n4p  . mouth rinse  15 mL Mouth Rinse BID  . vancomycin  750 mg Intravenous Q24H   Continuous Infusions: . sodium chloride Stopped (02/06/16 0600)  . heparin 750 Units/hr (02/06/16 0700)  .  sodium bicarbonate  infusion 1000 mL       LOS: 2 days    Time spent: 35 minutes.     Alba Coryegalado, Kaylynn Chamblin A, MD [facility] Pager [phone]  If 7PM-7AM, please contact  night-coverage [email or web address] Password [email or web address] 02/06/2016, 8:23 AM

## 2016-02-06 NOTE — Progress Notes (Signed)
RT placed patient on BIPAP HS. 14/7 and 30%. Patient tolerating well at this time.

## 2016-02-07 DIAGNOSIS — J9600 Acute respiratory failure, unspecified whether with hypoxia or hypercapnia: Secondary | ICD-10-CM

## 2016-02-07 LAB — URINE CULTURE: Culture: NO GROWTH

## 2016-02-07 LAB — CBC
HCT: 28.4 % — ABNORMAL LOW (ref 39.0–52.0)
Hemoglobin: 9.4 g/dL — ABNORMAL LOW (ref 13.0–17.0)
MCH: 37 pg — ABNORMAL HIGH (ref 26.0–34.0)
MCHC: 33.1 g/dL (ref 30.0–36.0)
MCV: 111.8 fL — ABNORMAL HIGH (ref 78.0–100.0)
PLATELETS: 167 10*3/uL (ref 150–400)
RBC: 2.54 MIL/uL — AB (ref 4.22–5.81)
RDW: 13.5 % (ref 11.5–15.5)
WBC: 17.3 10*3/uL — AB (ref 4.0–10.5)

## 2016-02-07 LAB — GLUCOSE, CAPILLARY
GLUCOSE-CAPILLARY: 118 mg/dL — AB (ref 65–99)
GLUCOSE-CAPILLARY: 127 mg/dL — AB (ref 65–99)
GLUCOSE-CAPILLARY: 88 mg/dL (ref 65–99)
Glucose-Capillary: 92 mg/dL (ref 65–99)
Glucose-Capillary: 124 mg/dL — ABNORMAL HIGH (ref 65–99)

## 2016-02-07 LAB — PHOSPHORUS: PHOSPHORUS: 2.7 mg/dL (ref 2.5–4.6)

## 2016-02-07 LAB — BASIC METABOLIC PANEL
Anion gap: 11 (ref 5–15)
BUN: 96 mg/dL — AB (ref 6–20)
CALCIUM: 9 mg/dL (ref 8.9–10.3)
CHLORIDE: 108 mmol/L (ref 101–111)
CO2: 21 mmol/L — ABNORMAL LOW (ref 22–32)
CREATININE: 1.93 mg/dL — AB (ref 0.61–1.24)
GFR, EST AFRICAN AMERICAN: 36 mL/min — AB (ref >60)
GFR, EST NON AFRICAN AMERICAN: 31 mL/min — AB (ref >60)
Glucose, Bld: 135 mg/dL — ABNORMAL HIGH (ref 65–99)
Potassium: 4.4 mmol/L (ref 3.5–5.1)
Sodium: 140 mmol/L (ref 135–145)

## 2016-02-07 LAB — MAGNESIUM: MAGNESIUM: 2.6 mg/dL — AB (ref 1.7–2.4)

## 2016-02-07 LAB — LACTIC ACID, PLASMA: Lactic Acid, Venous: 10.7 mmol/L (ref 0.5–1.9)

## 2016-02-07 LAB — HEPARIN LEVEL (UNFRACTIONATED): HEPARIN UNFRACTIONATED: 0.41 [IU]/mL (ref 0.30–0.70)

## 2016-02-07 LAB — PROCALCITONIN: Procalcitonin: 1.86 ng/mL

## 2016-02-07 MED ORDER — EPINEPHRINE HCL 1 MG/ML IJ SOLN
INTRAMUSCULAR | Status: AC
  Filled 2016-02-07: qty 5

## 2016-02-07 MED ORDER — SODIUM CHLORIDE 0.9 % IV SOLN
INTRAVENOUS | Status: DC
  Administered 2016-02-07: 14:00:00 via INTRAVENOUS

## 2016-02-07 MED ORDER — EPINEPHRINE HCL 0.1 MG/ML IJ SOSY
PREFILLED_SYRINGE | INTRAMUSCULAR | Status: AC
  Filled 2016-02-07: qty 50

## 2016-02-07 MED FILL — Medication: Qty: 1 | Status: AC

## 2016-02-10 ENCOUNTER — Telehealth: Payer: Self-pay

## 2016-02-10 NOTE — Telephone Encounter (Signed)
On 02/10/2016 I received a death certificate from [facility] ([facility] The death certificate is for burial. The patient is a patient of Doctor Maneem. [id number]. On 02/11/2016 I received the death certificate back from Doctor Maneem. I got the death certificate ready and called the funeral home to let them know the death certificate is ready for pickup.

## 2016-02-11 LAB — CULTURE, BLOOD (ROUTINE X 2)
CULTURE: NO GROWTH
Culture: NO GROWTH

## 2016-03-06 NOTE — Progress Notes (Signed)
PULMONARY / CRITICAL CARE MEDICINE   Name: Chase Benjamin MRN: [id number] DOB: 07-12-1931    ADMISSION DATE:  02/03/2016 CONSULTATION DATE:  01/14/2016  REFERRING MD:  Rhunette Croft - EDP  CHIEF COMPLAINT:  SOB   BRIEF Chase Benjamin is a 82 y.o. male with PMH as outlined below and who resides at [facility] (unclear reason why he is at [facility] -  called daughter Judeth Cornfield but no answer, also called facility but no answer).  He was brought to [facility] ED 8/31 with SOB and tachypnea as well as neck pain.  Symptoms had apparently started after he woke up and were worse with movement.  In ED, he had tachypnea and mild increase in WOB so was started on BiPAP.  CXR revealed pulmonary edema.  Trop elevated at 5 and EKG with ST depressions. Admitted with NSTEMI and resp failure requiring Bipap. He was transferred back to ICU  9/2 for worsening mental status after refusing bipap. He improved after being placed on Bipap and did not require intubation.   STUDIES:  CXR 9/2 > Bibasilar atelectasis, no edema.  CULTURES: None.  ANTIBIOTICS: Cefepime 9/2 > Vanco 9/2>  SIGNIFICANT EVENTS: 8/31 > admitted with NSTEMI.  LINES/TUBES: PIVx2   SUBJECTIVE/OVERNIGHT/INTERVAL HX Wore bipap overnight. Mild hypotension.     VITAL SIGNS: BP (!) 88/73   Pulse (!) 42   Temp (!) 95.4 F (35.2 C) (Axillary)   Resp (!) 26   Ht 5\' 9"  (1.753 m)   Wt 63 kg (138 lb 14.2 oz)   SpO2 96%   BMI 20.51 kg/m   HEMODYNAMICS:    VENTILATOR SETTINGS: FiO2 (%):  [40 %] 40 %  INTAKE / OUTPUT: I/O last 3 completed shifts: In: 2959.2 [P.O.:440; I.V.:2119.2; IV Piggyback:400] Out: 610 [Urine:610]   PHYSICAL EXAMINATION: General: Elderly male, No distress on bipap  Neuro: Opens eyes to voice. No focal deficits HEENT: [name]. PERRL, sclerae anicteric.  Cardiovascular: RRR, no M/R/G.  Lungs: Respirations even and unlabored.  Few scattered rhonchi  Abdomen: BS x 4, soft, NT/ND.  Musculoskeletal: No gross deformities, no  edema.  Skin: Intact, warm, no rashes.  LABS:  PULMONARY  Recent Labs Lab 01/29/2016 1728 02/06/16 0605 02/06/16 0610 02/06/16 1705  PHART 7.221* 7.270* 7.342* 7.383  PCO2ART 40.9 27.1* 24.1* 24.2*  PO2ART 72.8* 21.0* 110* 106  HCO3 16.2* 12.4* 12.7* 14.1*  TCO2  --  13  --   --   O2SAT 89.8 29.0 97.0 97.2    CBC  Recent Labs Lab 02/05/16 0357 02/06/16 0319 22-Feb-2016 0221  HGB 11.7* 10.3* 9.4*  HCT 35.4* 31.5* 28.4*  WBC 16.7* 19.7* 17.3*  PLT 181 198 167    COAGULATION  Recent Labs Lab 01/30/2016 1355  INR 1.11    CARDIAC    Recent Labs Lab 01/21/2016 1355 01/21/2016 1644 02/05/16 0039 02/05/16 0540 02/06/16 0319  TROPONINI 5.18* 5.79* 4.95* 6.17* 5.92*   No results for input(s): PROBNP in the last 168 hours.   CHEMISTRY  Recent Labs Lab 01/29/2016 1355 02/05/16 0047 02/06/16 0319 Feb 22, 2016 0221  NA 141  142 143 141 140  K 3.9  3.9 3.9 4.9 4.4  CL 111  112* 108 111 108  CO2 21*  21* 24 15* 21*  GLUCOSE 166*  168* 224* 95 135*  BUN 29*  30* 31* 75* 96*  CREATININE 1.60*  1.70* 1.88* 2.05* 1.93*  CALCIUM 8.7*  8.8* 9.3 9.0 9.0  MG  --  2.4 2.3 2.6*  PHOS  --  3.1 3.9 2.7   Estimated Creatinine Clearance: 26.7 mL/min (by C-G formula based on SCr of 1.93 mg/dL).   LIVER  Recent Labs Lab 01/09/2016 1355  AST 45*  ALT 23  ALKPHOS 47  BILITOT 1.0  PROT 7.1  ALBUMIN 3.5  INR 1.11     INFECTIOUS  Recent Labs Lab 02/06/16 0319 02/06/16 0904 02/06/16 1133  LATICACIDVEN 5.6* 4.6* 4.4*     ENDOCRINE CBG (last 3)   Recent Labs  07-21-2015 0018 07-21-2015 0336 07-21-2015 0743  GLUCAP 127* 92 124*     IMAGING x48h  - image(s) personally visualized    DISCUSSION: 82 y.o. male from SNF, admitted 8/31 with NSTEMI.  Started on BiPAP for pulmonary edema and mildly increased WOB. Now back to ICU with tachycardia, increasing WBC count and LA.  ASSESSMENT / PLAN:  CARDIOVASCULAR A:  NSTEMI. Medical management Hx  HLD. Suspected ischemic cardiomyopathy - EF 25% Mod MR  Hypotension/ Presumed sepsis P:  Mgmt per cards Heparin drip Unable to tolerate B blocker or ARB at this time r/t hypotension  ASA, lipitor Overall poor prognosis  F/u lactic acid   PULMONARY A: Pulmonary edema. Dyspnea with mildly increased WOB. Hx COPD per report - no PFT's in system.  P:   Continue Bipap PRN and qhs  DuoNebs q6hr in lieu of outpatient spiriva.  RENAL A:   Acute on Chronic Renal Failure - Last Creatinine 1.4 on 07/15/15. NAGMA - due to hyperventilation. Improved.  Hypocalcemia. P:   Follow urine output and lactic acid.  F/u chem  D/c HCO3 gtt   GASTROINTESTINAL A:   GERD. Nutrition. P:   Hold Protonix Advance to heart healthy diet  HEMATOLOGIC A:   VTE Prophylaxis. P:  SCD's / Heparin gtt. CBC in AM.  INFECTIOUS A:   Possible Acute COPD Exac at admit 01/24/2016 - no evidence 02/05/16 P:   Continue vanco, cefepime for presumed sepsis. F/u lactate  Pct   ENDOCRINE A:   Hyperglycemia - No h/o DM. P:   Checking Hgb A1c SSI per Sensitive Algorithm Accu-Checks q4hr  NEUROLOGIC A:   Acute encephalopathy - unknown baseline. Hx schizophrenia, depression. P:   Monitor clinically. Avoid sedating meds. Hold preadmission citalopram, cyclobenzaprine, norco, olanzapine.  Family updated: at admit pcc 01/14/2016 - Attempted to call daughter George HughStephanie Legrande; however, no answer.  Also attempted to call pt's SNF [facility]([place]) but also no answer.  Will list as full code until contact can be made with facility or family to inquire on whether or not pt had any advanced directives.  No family at bedside 9/3  Interdisciplinary Family Meeting v Palliative Care Meeting:  Due by: 02/10/16.   Dirk DressKaty Whiteheart, NP 02/23/2016  12:06 PM Pager: [phone] or [phone]  Attending note: I have seen and examined the patient with nurse practitioner/resident and agree with the note. History,  labs and imaging reviewed.  82 Y/o with NSTEMI, resp failure. CHF, resp failure On and off Bipap. BP is still tenuous.   Continue vanco, cefepime Medical management of MI as per cardiology. Bipap as needed.   Critical care time - 35 mins. This represents my time independent of the NPs time taking care of the pt.  Chilton GreathousePraveen Ceira Hoeschen MD [facility] Pager [phone] If no answer or after 3pm call: [phone] 02/14/2016, 1:57 PM

## 2016-03-06 NOTE — Progress Notes (Signed)
Primary cardiologist: Dr. Tonny Bollman  Seen for followup: NSTEMI  Subjective:    No chest pain or breathlessness at rest. Used BiPAP overnight.  Objective:   Temp:  [95.4 F (35.2 C)-98.1 F (36.7 C)] 95.4 F (35.2 C) (09/03 0823) Pulse Rate:  [25-180] 42 (09/03 0700) Resp:  [16-38] 26 (09/03 0800) BP: (55-104)/(44-75) 88/73 (09/03 0800) SpO2:  [94 %-100 %] 94 % (09/03 0700) FiO2 (%):  [40 %] 40 % (09/03 0603) Weight:  [138 lb 14.2 oz (63 kg)] 138 lb 14.2 oz (63 kg) (09/03 0400) Last BM Date: 02/05/16  Filed Weights   02/05/16 0400 02/06/16 0421 03/05/2016 0400  Weight: 133 lb 6.1 oz (60.5 kg) 138 lb (62.6 kg) 138 lb 14.2 oz (63 kg)    Intake/Output Summary (Last 24 hours) at 02/12/2016 0833 Last data filed at 02/10/2016 0800  Gross per 24 hour  Intake          1579.17 ml  Output              500 ml  Net          1079.17 ml    Telemetry: Sinus tachycardia with occasional PVCs.  Exam:  General: No distress. Flat affect.  Lungs: Few crackles in the bases.  Cardiac: RRR without gallop.  Abdomen: NABS.  Extremities: No pitting edema.  Lab Results:  Basic Metabolic Panel:  Recent Labs Lab 02/05/16 0047 02/06/16 0319 03/02/2016 0221  NA 143 141 140  K 3.9 4.9 4.4  CL 108 111 108  CO2 24 15* 21*  GLUCOSE 224* 95 135*  BUN 31* 75* 96*  CREATININE 1.88* 2.05* 1.93*  CALCIUM 9.3 9.0 9.0  MG 2.4 2.3 2.6*    Liver Function Tests:  Recent Labs Lab 02/29/2016 1355  AST 45*  ALT 23  ALKPHOS 47  BILITOT 1.0  PROT 7.1  ALBUMIN 3.5    CBC:  Recent Labs Lab 02/05/16 0357 02/06/16 0319 02/19/2016 0221  WBC 16.7* 19.7* 17.3*  HGB 11.7* 10.3* 9.4*  HCT 35.4* 31.5* 28.4*  MCV 113.5* 114.1* 111.8*  PLT 181 198 167    Cardiac Enzymes:  Recent Labs Lab 02/05/16 0039 02/05/16 0540 02/06/16 0319  TROPONINI 4.95* 6.17* 5.92*    Echocardiogram 02/05/2016: Study Conclusions  - Left ventricle: Septal apica and posterior lateral akinesis  diffuse hypokinesis mid and basal septum move best. The cavity   size was moderately dilated. The estimated ejection fraction was   25%. Doppler parameters are consistent with both elevated   ventricular end-diastolic filling pressure and elevated left   atrial filling pressure. - Mitral valve: There was moderate regurgitation. - Left atrium: The atrium was mildly dilated. - Atrial septum: No defect or patent foramen ovale was identified. - Tricuspid valve: There was moderate regurgitation. - Pulmonary arteries: PA peak pressure: 51 mm Hg (S).   Medications:   Scheduled Medications: . aspirin  81 mg Oral Daily  . atorvastatin  10 mg Oral q1800  . ceFEPime (MAXIPIME) IV  2 g Intravenous Q24H  . chlorhexidine  15 mL Mouth Rinse BID  . clopidogrel  75 mg Oral Daily  . feeding supplement (ENSURE ENLIVE)  237 mL Oral BID BM  . insulin aspart  0-9 Units Subcutaneous Q4H  . ipratropium-albuterol  3 mL Nebulization Q6H  . mouth rinse  15 mL Mouth Rinse q12n4p  . mouth rinse  15 mL Mouth Rinse BID  . vancomycin  750 mg Intravenous Q24H    Infusions: .  sodium chloride Stopped (02/06/16 0600)  . heparin 750 Units/hr (02/06/16 0700)  .  sodium bicarbonate  infusion 1000 mL 50 mL/hr at 02/12/2016 0528    PRN Medications: sodium chloride, sodium chloride, acetaminophen   Assessment:   1. NSTEMI, peak troponin I 6.2. No active chest pain. Reviewed most recent cardiology note by Dr. Excell Seltzerooper. Plan is for conservative management with medical therapy. He is currently on aspirin, Plavix, and heparin. No beta blocker currently with relative hypotension, and no ACE inhibitor/ARB with progressive renal insufficiency.  2. Suspected ischemic cardiomyopathy with LVEF 25% by echocardiogram.  3. Moderate mitral regurgitation.  4. Schizophrenia.  5. Limited functional capacity, essentially wheelchair-bound, previously in an assisted-living facility.   Plan/Discussion:    Follow-up ECG today still  suggestive of fairly global ischemic pattern and likely multivessel or LM disease. Unable to add beta blocker, ACE inhibitor, or ARB at this time due to relatively low blood pressure. Continue aspirin, Lipitor and Plavix, heparin infusion for around 72 hours. Overall poor prognosis in light of multiple comorbidities.   Jonelle SidleSamuel G. McDowell, M.D., F.A.C.C.

## 2016-03-06 NOTE — Discharge Summary (Signed)
Physician Discharge Summary       Patient ID: Chase Benjamin MRN: [id number] DOB/AGE: 1931-10-13 82 y.o.  Admit date: 01/09/2016 Discharge date: 02/11/2016  Discharge Diagnoses:  Cardiac arrest NSTEMI CHF Pulmonary edema Ischemic cardiomyopathy Mod MR  Renal failure  Detailed Hospital Course:  Chase Benjamin is a 82 y.o. male with PMH as outlined below and who resides at [facility] He was brought to [facility] ED 8/31 with SOB and tachypnea as well as neck pain.  Symptoms had apparently started after he woke up and were worse with movement.  In ED, he had tachypnea and mild increase in WOB so was started on BiPAP.  CXR revealed pulmonary edema.  Trop elevated at 5 and EKG with ST depressions. Admitted with NSTEMI and resp failure requiring Bipap. He was transferred back to ICU  9/2 for worsening mental status after refusing bipap. He improved after being placed on Bipap and did not require intubation.  Cardiology was consulted and recommended medical management.   On 02/06/16 he was transferred back to ICU for new hypotension, rising WBC, worsening tachycardia. He was started on broad spectrum antibiotics and needed Bipap on and off. On the evening of 2016-02-26 he had an abrupt loss of consciousness with PEA arrest degenerating to asystole and ACLS started while attempting to reach again the POA.  After initial attempts to restore circulation were unsuccessful and despite ET/bagging/ POA requested stop CPR and note to have very slow agonal rhythm, minimal corresponding thready femoral pulse and no spont resp so further efforts to revive were stopped   Discharge Exam: BP (!) 100/50   Pulse 100   Temp (!) 96.5 F (35.8 C) (Oral)   Resp (!) 27   Ht 5\' 9"  (1.753 m)   Wt 138 lb 14.2 oz (63 kg)   SpO2 96%   BMI 20.51 kg/m    Labs at discharge Lab Results  Component Value Date   CREATININE 1.93 (H) 26-Feb-2016   BUN 96 (H) 02/26/2016   NA 140 02/26/2016   K 4.4 02/26/2016   CL 108 Feb 26, 2016    CO2 21 (L) 26-Feb-2016   Lab Results  Component Value Date   WBC 17.3 (H) Feb 26, 2016   HGB 9.4 (L) 02/26/16   HCT 28.4 (L) 02/26/2016   MCV 111.8 (H) 2016/02/26   PLT 167 02/26/2016   Lab Results  Component Value Date   ALT 23 01/25/2016   AST 45 (H) 01/18/2016   ALKPHOS 47 01/18/2016   BILITOT 1.0 01/22/2016   Lab Results  Component Value Date   INR 1.11 01/17/2016    Current radiology studies No results found.  Disposition:  20-Expired     Medication List    ASK your doctor about these medications   acetaminophen 500 MG tablet Commonly known as:  TYLENOL Take 2 tablets (1,000 mg total) by mouth 2 (two) times daily as needed for mild pain.   aspirin EC 81 MG tablet Take 81 mg by mouth daily.   citalopram 20 MG tablet Commonly known as:  CELEXA Take 20 mg by mouth daily.   COOL & HEAT PATCH EX Apply 1 patch topically 4 (four) times daily as needed (pain).   cyclobenzaprine 5 MG tablet Commonly known as:  FLEXERIL Take 5 mg by mouth at bedtime.   fenofibrate micronized 200 MG capsule Commonly known as:  LOFIBRA Take 200 mg by mouth daily.   ferrous sulfate 325 (65 FE) MG tablet Take 325 mg by mouth daily.  folic acid 800 MCG tablet Commonly known as:  FOLVITE Take 800 mcg by mouth daily.   HYDROcodone-acetaminophen 5-325 MG tablet Commonly known as:  NORCO/VICODIN Take 1 tablet by mouth every 6 (six) hours as needed for moderate pain.   nitroGLYCERIN 0.4 MG SL tablet Commonly known as:  NITROSTAT Place 0.4 mg under the tongue every 5 (five) minutes as needed for chest pain.   NUTRITIONAL SUPPLEMENT PO Take by mouth See admin instructions. Give Mighty Shake twice daily at 2pm and 7pm   OLANZapine 2.5 MG tablet Commonly known as:  ZYPREXA Take 2.5 mg by mouth daily.   pyridOXINE 50 MG tablet Commonly known as:  VITAMIN B-6 Take 25 mg by mouth daily.   ROBAFEN DM 10-100 MG/5ML liquid Generic drug:  Dextromethorphan-Guaifenesin Take 5  mLs by mouth every 6 (six) hours as needed (cough).   tiotropium 18 MCG inhalation capsule Commonly known as:  SPIRIVA Place 18 mcg into inhaler and inhale daily.   traMADol 50 MG tablet Commonly known as:  ULTRAM Take 0.5 tablets (25 mg total) by mouth every 12 (twelve) hours as needed for severe pain.   Vitamin D 2000 units Caps Take 2,000 Units by mouth daily.       Discharged Condition: Deceased  Chilton GreathousePraveen Deyanira Fesler MD [facility] Pager [phone] If no answer or after 3pm call: [phone] 02/11/2016, 4:45 AM

## 2016-03-06 NOTE — Progress Notes (Signed)
eLink Physician-Brief Progress Note Patient Name: Chase Benjamin DOB: Jan 28, 1932 MRN: [id number]   Date of Service  02/12/2016  HPI/Events of Note  Pt with abrupt LOC /pea degenerating to asystole and ACLS started while attempting to reach again the POA.  After initial attempts to restore circulation were unsuccessful and despite ET/bagging/ POA requested stop cpr and note to have very slow agonal rhythm, minimal corresponding thready fem pulse, full LOC and no spont resp so further efforts to revive were stopped  eICU Interventions  Pt extubated and placed on NP for terminal care     Intervention Category Major Interventions: Code management / supervision  Sandrea HughsMichael Liliana Brentlinger 02/06/2016, 6:09 PM

## 2016-03-06 NOTE — Progress Notes (Signed)
ANTICOAGULATION CONSULT NOTE - Follow Up Consult  Pharmacy Consult for Heparin  Indication: chest pain/ACS  Patient Measurements: Height: 5\' 9"  (175.3 cm) Weight: 138 lb 14.2 oz (63 kg) IBW/kg (Calculated) : 70.7  Vital Signs: Temp: 95.4 F (35.2 C) (09/03 0823) Temp Source: Axillary (09/03 0823) BP: 88/73 (09/03 0800) Pulse Rate: 42 (09/03 0700)  Labs:  Recent Labs  01/23/2016 1355  02/05/16 0039 02/05/16 0047 02/05/16 0357 02/05/16 0540 02/05/16 0954 02/06/16 0319 01-Oct-2015 0221  HGB 11.7*  --   --   --  11.7*  --   --  10.3* 9.4*  HCT 34.3*  --   --   --  35.4*  --   --  31.5* 28.4*  PLT 201  --   --   --  181  --   --  198 167  APTT 32  --   --   --   --   --   --   --   --   LABPROT 14.3  --   --   --   --   --   --   --   --   INR 1.11  --   --   --   --   --   --   --   --   HEPARINUNFRC  --   < > 0.48  --   --   --  0.69 0.63 0.41  CREATININE 1.60*  1.70*  --   --  1.88*  --   --   --  2.05* 1.93*  TROPONINI 5.18*  < > 4.95*  --   --  6.17*  --  5.92*  --   < > = values in this interval not displayed.  Estimated Creatinine Clearance: 26.7 mL/min (by C-G formula based on SCr of 1.93 mg/dL).   Assessment: [age] YOM with elevated troponin, [facility] Decision made to medically manage- started on IV heparin.  Heparin level remains therapeutic this morning. Hgb 9.4, plts 167- slight drops, no bleeding noted. Troponins remain elevated. BP remains soft.  Goal of Therapy:  Heparin level 0.3-0.7 units/ml Monitor platelets by anticoagulation protocol: Yes   Plan:  -Cont heparin at 750 units/hr -Follow stop time of heparin- plan per cardiology is for 72h which would be later today (started 8/31afternoon) -Daily heparin level and CBC -Follow for s/s bleeding  Daesha Insco D. Braydyn Schultes, PharmD, BCPS Clinical Pharmacist Pager: [phone] 03/01/2016 10:39 AM

## 2016-03-06 NOTE — Progress Notes (Signed)
The RN Janice Coffinyesha Cher Egnor and Tory EmeraldMichelle Toler auscultated the heart. No heart beat was heard and no spontaneous respirations were heard or felt. The pupils were fixed, dilated, and non-reactive to light. The patient was pronounced dead at 1827. The daughter Judeth CornfieldStephanie was called and was en route to the hospital.   The patient's belongings, including his dentures, were sent home with his daughter Judeth CornfieldStephanie. The patient placement card was also given to the daughter.

## 2016-03-06 NOTE — Progress Notes (Signed)
RT note- Taken of  Bipap for break, patient still has moderate work of breathing, continue to monitor.

## 2016-03-06 NOTE — Code Documentation (Signed)
CODE BLUE NOTE  Patient Name: Chase Benjamin   MRN: [id number]   Date of Birth/ Sex: 09-25-1931 , male      Admission Date: 02/02/2016  Attending Provider: Chilton GreathousePraveen Mannam, MD  Primary Diagnosis: <principal problem not specified>    Indication: Pt was in his usual state of health until this PM, when he was noted to be bradycardic. Code blue was subsequently called. At the time of arrival on scene, ACLS protocol was underway.    Technical Description:  - CPR performance duration:  13 minutes  - Was defibrillation or cardioversion used? No   - Was external pacer placed? No  - Was patient intubated pre/post CPR? Yes, intubated during CPR at 1753.    Medications Administered: Y = Yes; Blank = No Amiodarone    Atropine    Calcium    Epinephrine  Yx4  Lidocaine    Magnesium    Norepinephrine    Phenylephrine    Sodium bicarbonate    Vasopressin      Post CPR evaluation:  - Final Status - Was patient successfully resuscitated ? Yes - What is current rhythm? PEA  Both myself and senior code pager resident spoke to daughter, Chase Benjamin, who explained that the pt did not have a power of attorney, but that he would not want us to continue resuscitative efforts and he would want to pass naturally.   CPR stopped at 1800, pt extubated, agonal rhythm, thready femoral pulse, no spontaneous respirations. Pt made DNR. ICU team for terminal care.     Garth BignessKathryn Marielys Trinidad, MD  02/22/2016, 6:14 PM

## 2016-03-06 NOTE — Progress Notes (Signed)
Patient hypotensive this morning, not stable to be transfer out of ICU. Discussed with Dr Isaiah SergeMannam, CCM will take over.  Please call us back as needed. Patient not evaluated.

## 2016-03-06 NOTE — Progress Notes (Signed)
RT note-notified of patient Bradycardic and not responsive. BVM with 100% while intubation equipment being obtained. Intubated with 7.5 ETT and secured at 24 with positive end tidal co2. BBS confirmed by RN. Code blue stopped due to family decision.

## 2016-03-06 DEATH — deceased

## 2017-05-05 IMAGING — DX DG CHEST 1V PORT
2 series · 2 of 2 positions shown · non-contrast
Comparison: 02/05/2014

CLINICAL DATA: Shortness of breath.  Tachypnea.

EXAM:
PORTABLE CHEST 1 VIEW

[chest ap (1 of 2)]
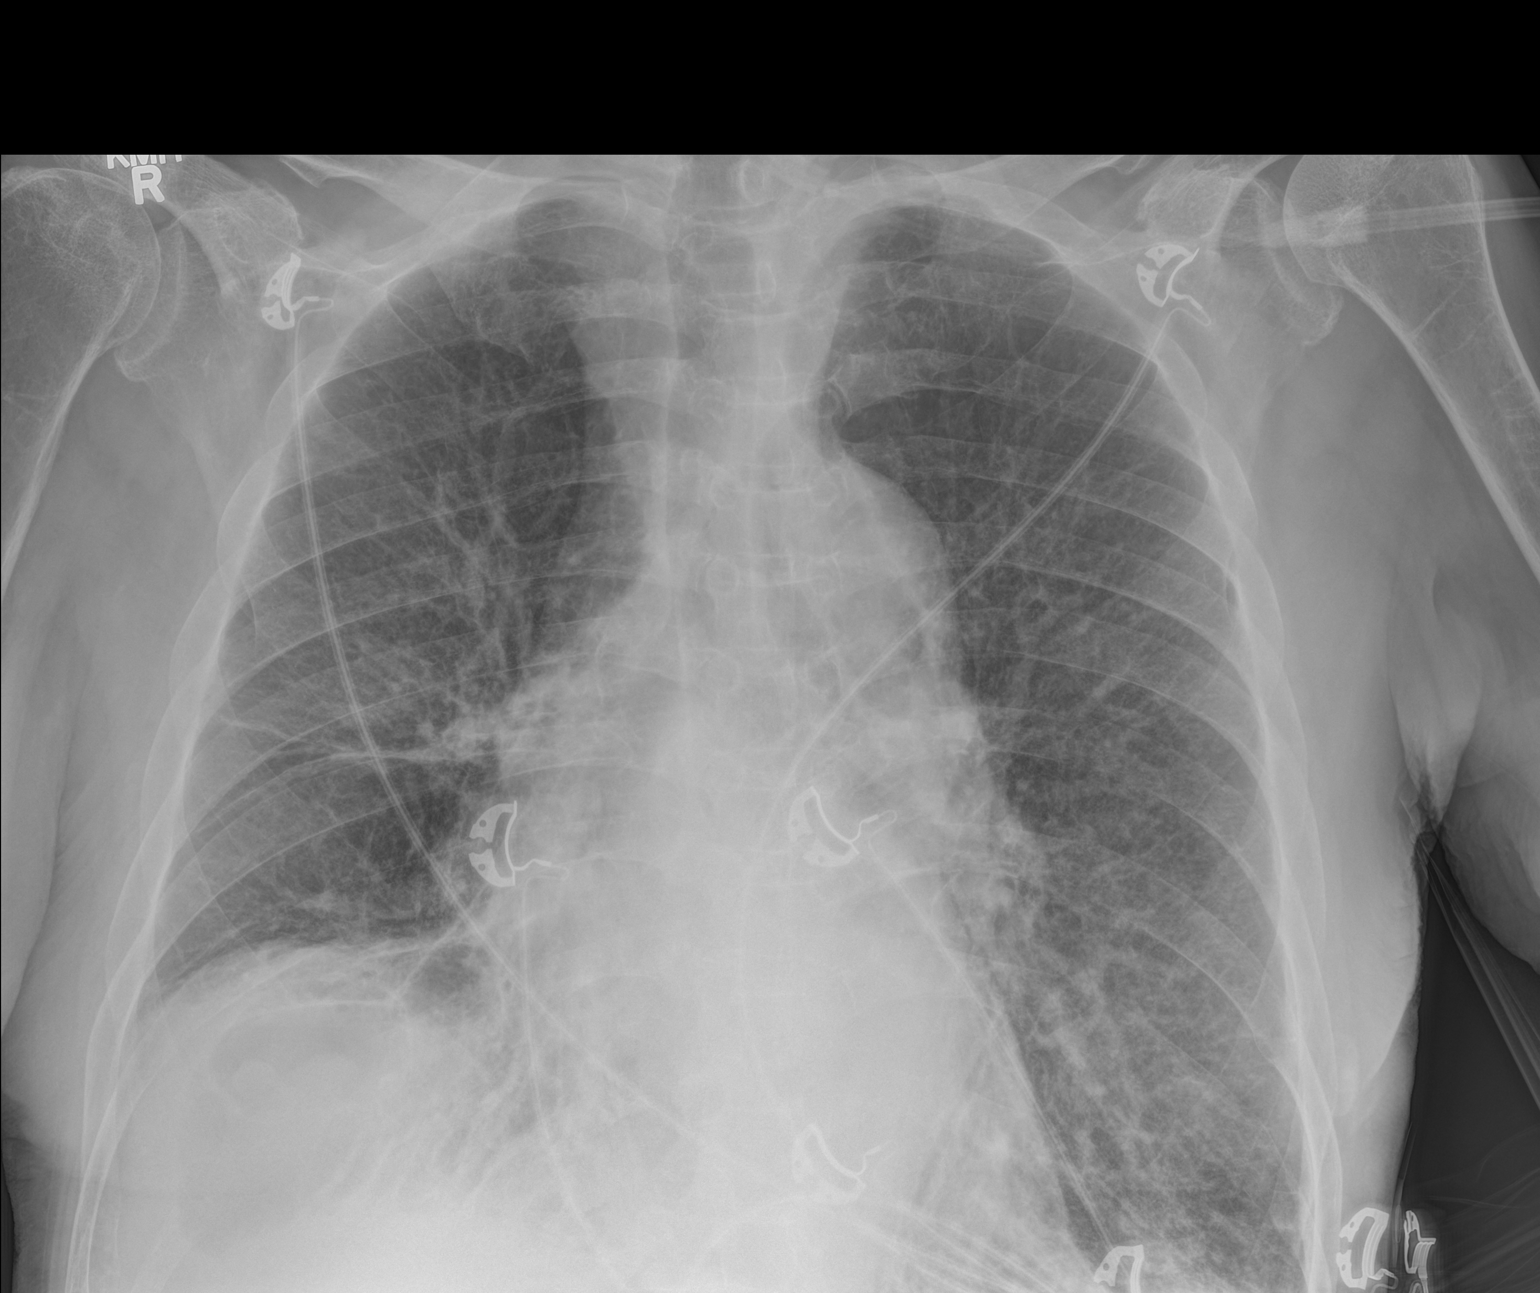

[chest ap (2 of 2)]
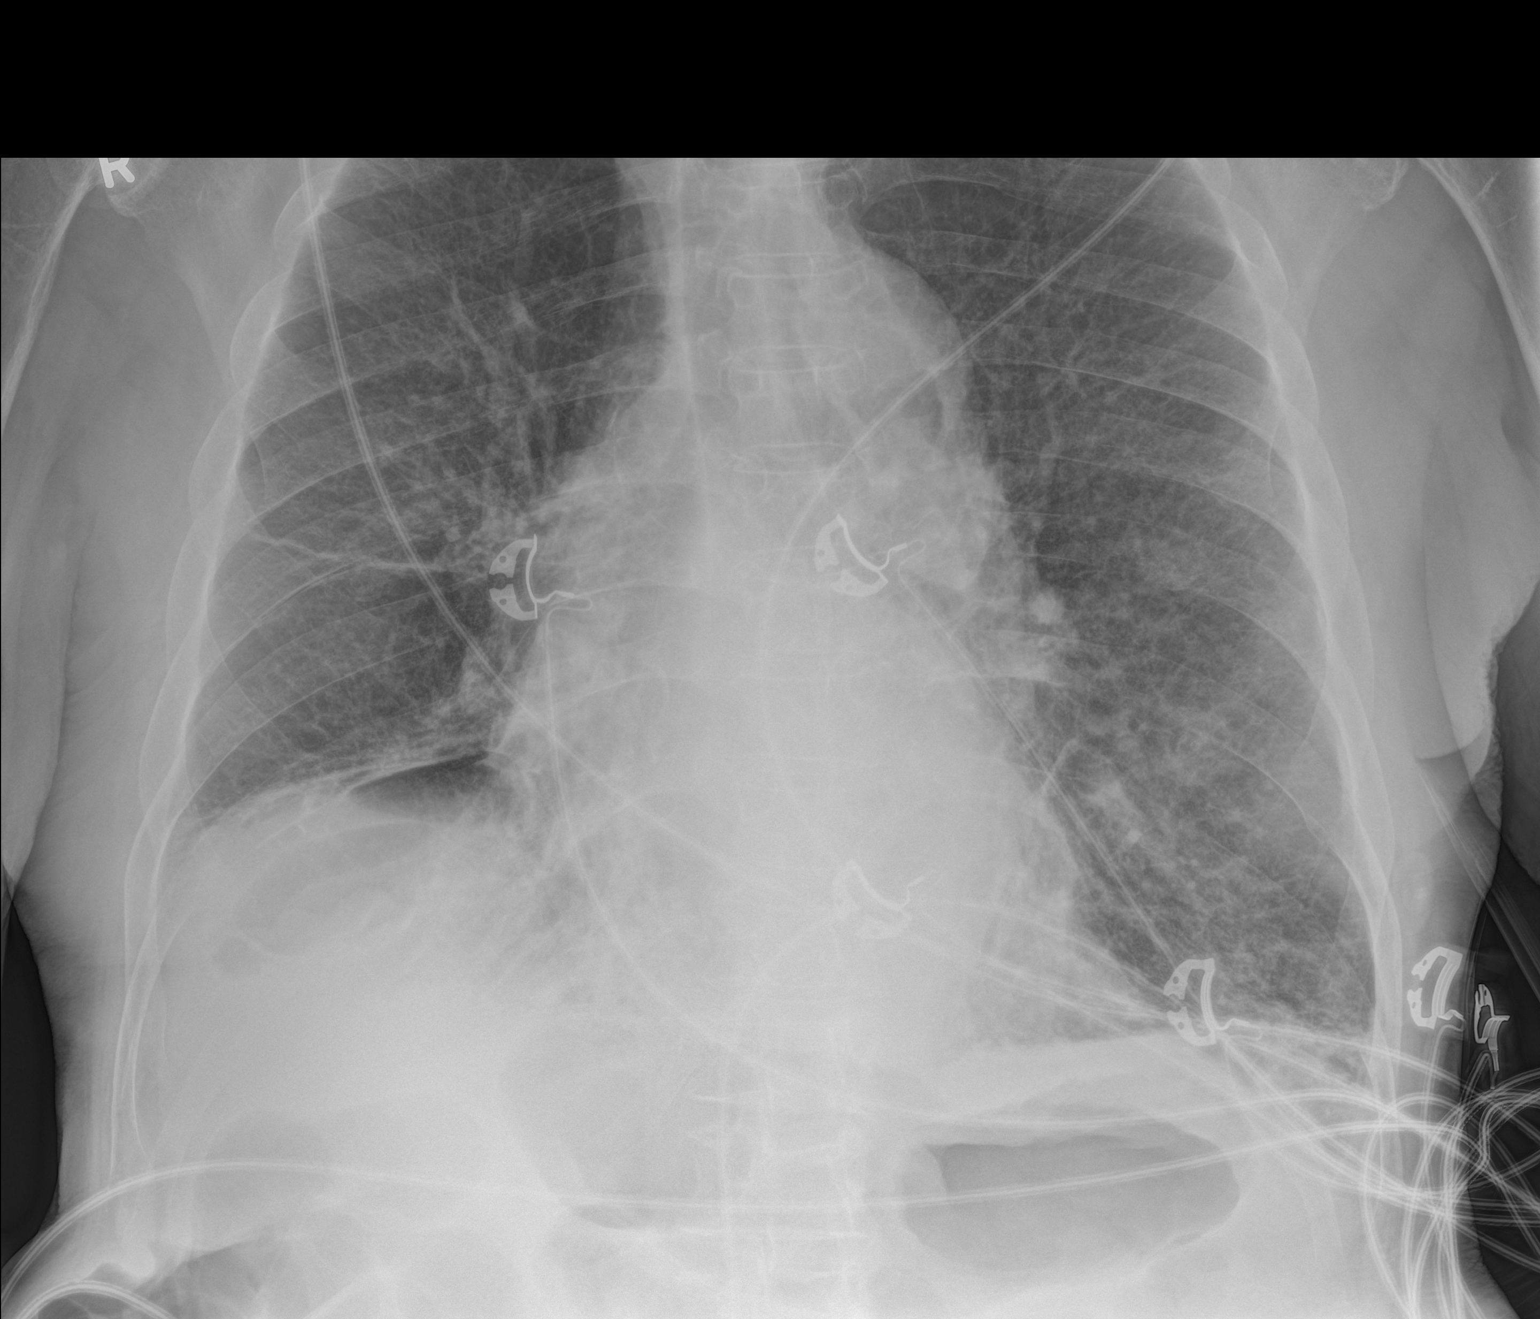

[2 of 2 positions shown; findings below may reference images not displayed]

FINDINGS: Chronically elevated right hemidiaphragm with associated scarring at
the right lung base and right mid lung. Tortuous thoracic aorta.
Heart size within normal limits. There is some slight worsening of
the perihilar density on the right compared to 02/05/14.

Airway thickening is present, suggesting bronchitis or reactive
airways disease. Interstitial accentuation increased from prior with
some indistinctness of pulmonary vasculature. Kerley B-lines
observed.

Tapering of the peripheral pulmonary vasculature favors emphysema.
IMPRESSION: 1. Interstitial accentuation with Kerley B-lines, typically
associated with interstitial pulmonary edema, although the heart
contour is not enlarged. Possibility of atypical pneumonia is
accordingly raised.
2. Perihilar and right basilar scarring, slightly increased in the
perihilar region compared to the 4553 comparison exam. Chronically
elevated right hemidiaphragm.
3. Emphysema.

## 2017-05-06 IMAGING — CR DG CHEST 1V PORT
2 series · 2 of 2 positions shown · non-contrast
Comparison: February 04, 2016

CLINICAL DATA: Respiratory failure

EXAM:
PORTABLE CHEST 1 VIEW

[AP (1 of 2)]
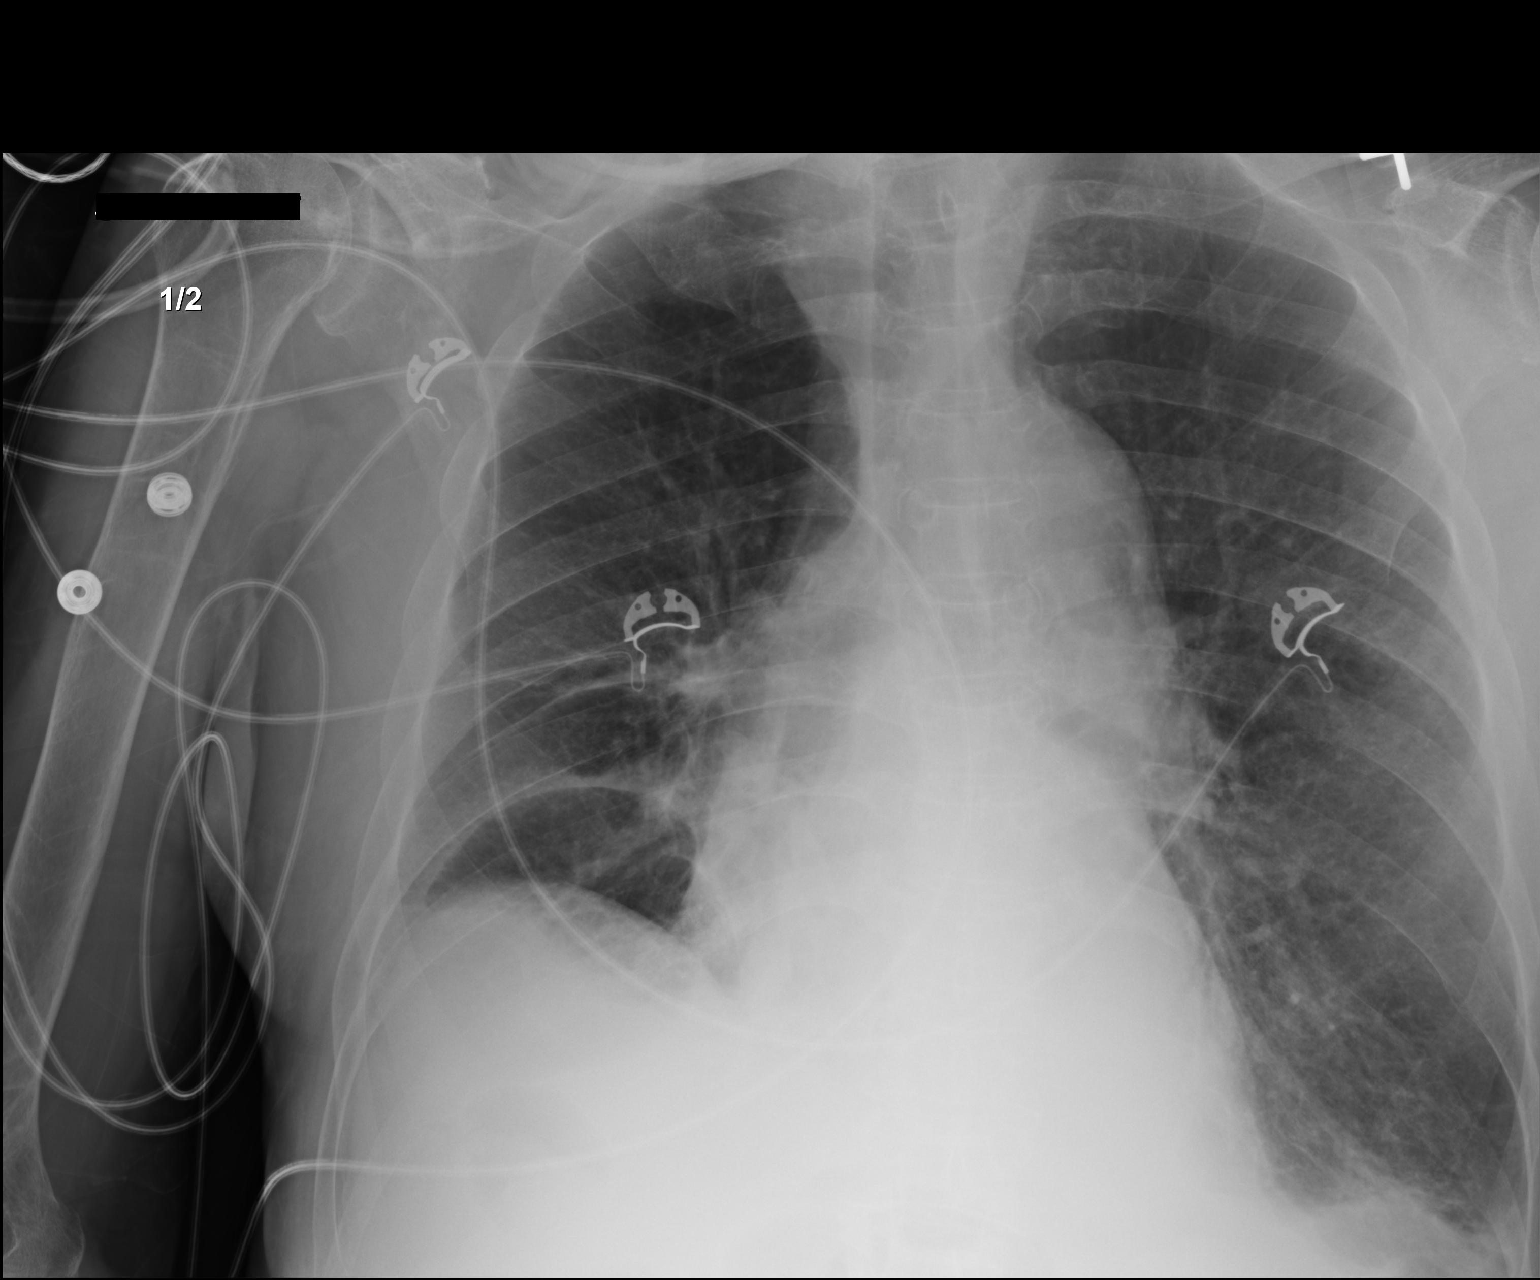

[AP (2 of 2)]
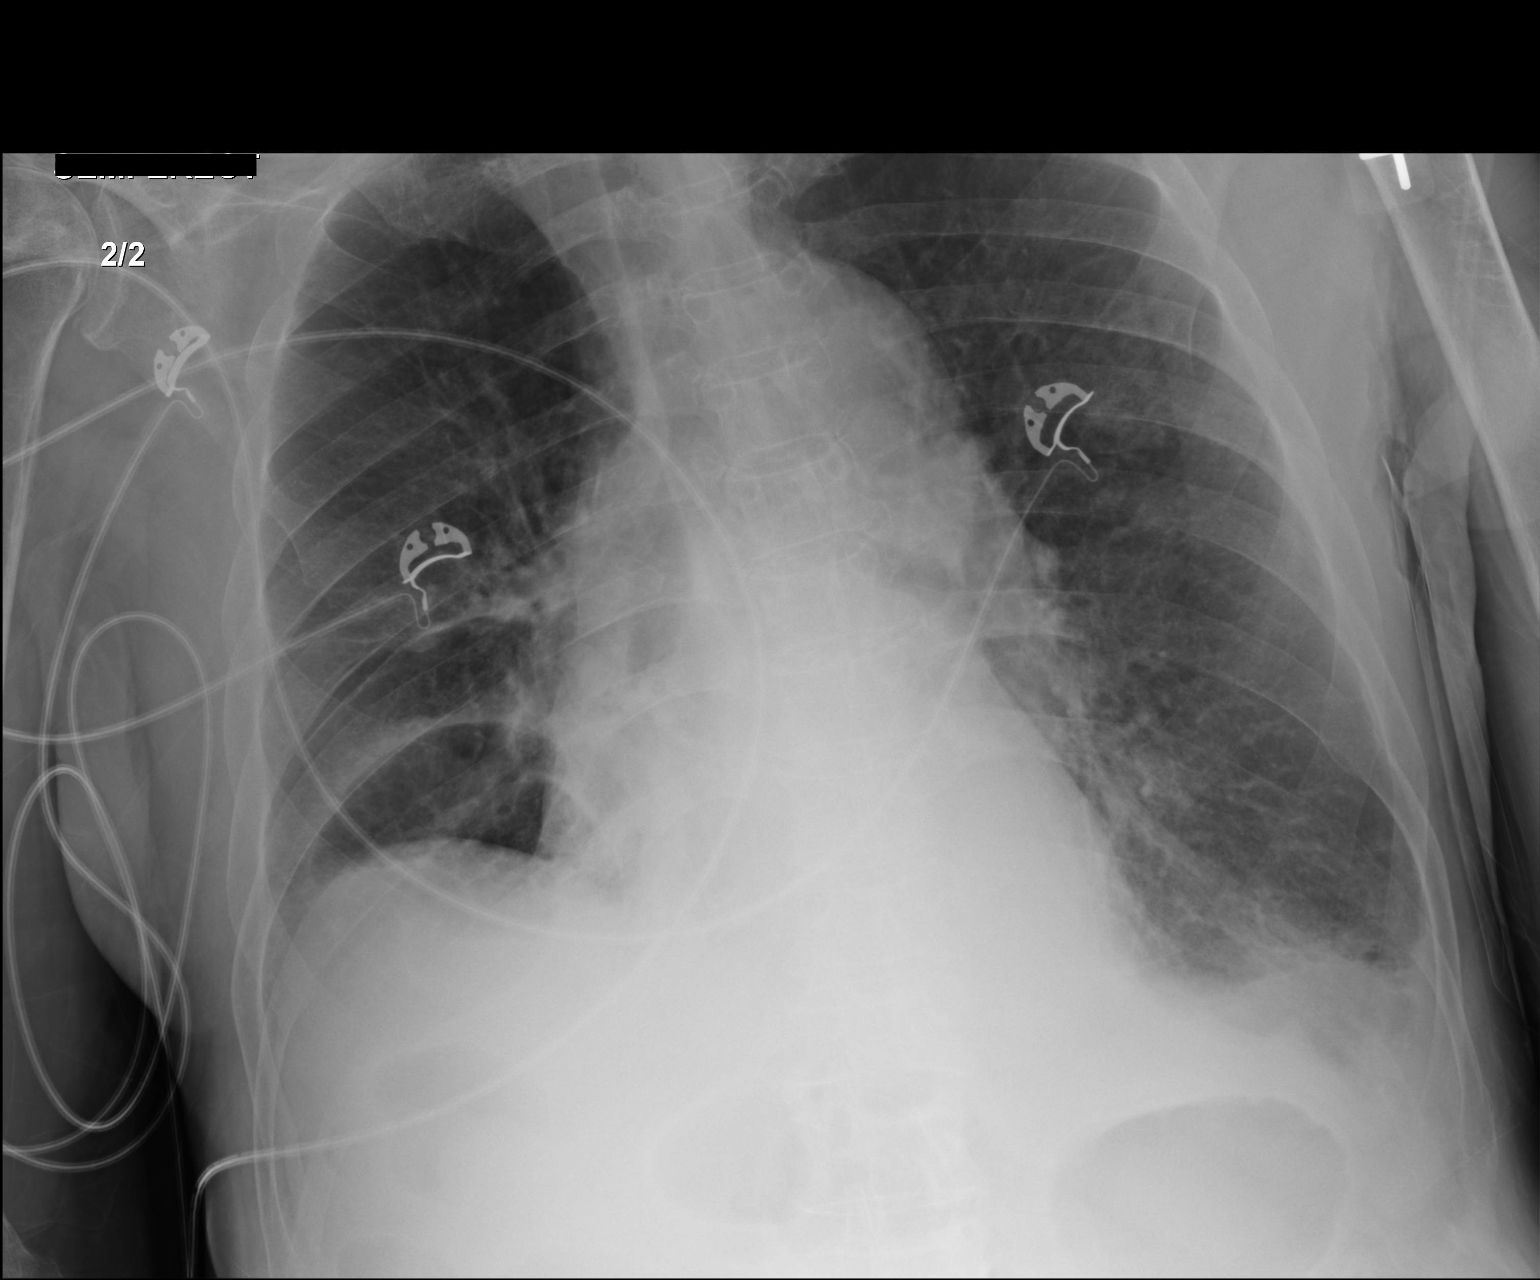

[2 of 2 positions shown; findings below may reference images not displayed]

FINDINGS: There has been interval clearing of interstitial edema bilaterally.
A slight degree of edema remains in the left base. There is
atelectatic change in the right mid and lower lung zones and left
base regions. There is no new opacity. There is persistent elevation
of the right hemidiaphragm. The heart is borderline prominent with
pulmonary vascularity within normal limits. No atrophy. No bone
lesions evident.
IMPRESSION: There has been interval resolution of interstitial edema except for
slight residual edema in the left base. Patchy atelectasis in lung
bases persists. There is persistent elevation of the right
hemidiaphragm. No new opacity. Stable cardiac silhouette.

## 2022-07-19 ENCOUNTER — Telehealth: Payer: Self-pay

## 2022-07-19 NOTE — Telephone Encounter (Signed)
Opened in error
